# Patient Record
Sex: Female | Born: 1980 | Race: Black or African American | Hispanic: No | Marital: Single | Smoking: Current every day smoker
Health system: Southern US, Community
[De-identification: ages and names within clinical notes are randomized; demographics above are authoritative.]

## PROBLEM LIST (undated history)

## (undated) DIAGNOSIS — Z789 Other specified health status: Secondary | ICD-10-CM

## (undated) HISTORY — PX: ECTOPIC PREGNANCY SURGERY: SHX613

---

## 2012-04-03 ENCOUNTER — Encounter (HOSPITAL_COMMUNITY): Payer: Self-pay | Admitting: Emergency Medicine

## 2012-04-03 ENCOUNTER — Emergency Department (HOSPITAL_COMMUNITY)
Admission: EM | Admit: 2012-04-03 | Discharge: 2012-04-03 | Disposition: A | Discharge status: Home / Self Care | Payer: Medicaid Other | Source: Home / Self Care

## 2012-04-03 DIAGNOSIS — T300 Burn of unspecified body region, unspecified degree: Secondary | ICD-10-CM

## 2012-04-03 MED ORDER — HYDROCODONE-ACETAMINOPHEN 5-325 MG PO TABS: 1.0000 | ORAL_TABLET | ORAL | Status: DC | PRN

## 2012-04-03 MED ORDER — KETOROLAC TROMETHAMINE 60 MG/2ML IM SOLN
INTRAMUSCULAR | Status: AC
  Filled 2012-04-03: qty 2

## 2012-04-03 MED ORDER — KETOROLAC TROMETHAMINE 60 MG/2ML IM SOLN
60.0000 mg | Freq: Once | INTRAMUSCULAR | Status: AC
  Administered 2012-04-03: 60 mg via INTRAMUSCULAR
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
## 2012-04-04 NOTE — ED Provider Notes (Signed)
Medical screening examination/treatment/procedure(s) were performed by non-physician practitioner and as supervising physician I was immediately available for consultation/collaboration.  Merida Alcantar, M.D.   Tennessee Perra C Marcianne Ozbun, MD 04/04/12 0835 

## 2012-07-18 ENCOUNTER — Encounter (HOSPITAL_COMMUNITY): Payer: Self-pay | Admitting: *Deleted

## 2012-07-18 ENCOUNTER — Emergency Department (HOSPITAL_COMMUNITY)
Admission: EM | Admit: 2012-07-18 | Discharge: 2012-07-18 | Disposition: A | Discharge status: Home / Self Care | Payer: Medicaid Other | Attending: Emergency Medicine | Admitting: Emergency Medicine

## 2012-07-18 DIAGNOSIS — K5289 Other specified noninfective gastroenteritis and colitis: Secondary | ICD-10-CM | POA: Insufficient documentation

## 2012-07-18 DIAGNOSIS — R197 Diarrhea, unspecified: Secondary | ICD-10-CM | POA: Insufficient documentation

## 2012-07-18 DIAGNOSIS — F17200 Nicotine dependence, unspecified, uncomplicated: Secondary | ICD-10-CM | POA: Insufficient documentation

## 2012-07-18 DIAGNOSIS — R509 Fever, unspecified: Secondary | ICD-10-CM | POA: Insufficient documentation

## 2012-07-18 DIAGNOSIS — K529 Noninfective gastroenteritis and colitis, unspecified: Secondary | ICD-10-CM

## 2012-07-18 DIAGNOSIS — R630 Anorexia: Secondary | ICD-10-CM | POA: Insufficient documentation

## 2012-07-18 MED ORDER — ONDANSETRON 4 MG PO TBDP
4.0000 mg | ORAL_TABLET | Freq: Once | ORAL | Status: AC
  Administered 2012-07-18: 4 mg via ORAL
  Filled 2012-07-18: qty 1

## 2012-07-18 NOTE — ED Notes (Signed)
Pt has been having abdominal pain, vomiting, and diarrhea since yesterday.  No urinary symptoms.  No vaginal discharge or bleeding.

## 2012-07-18 NOTE — ED Provider Notes (Addendum)
History     CSN: [id number]  Arrival date & time 07/18/12  1351   First MD Initiated Contact with Patient 07/18/12 1415      Chief Complaint  Patient presents with  . Nausea  . Emesis  . Diarrhea    (Consider location/radiation/quality/duration/timing/severity/associated sxs/prior treatment) Patient is a 32 y.o. female presenting with vomiting and diarrhea. The history is provided by the patient.  Emesis  This is a new problem. The current episode started yesterday. The problem occurs 2 to 4 times per day. The problem has been resolved. The emesis has an appearance of stomach contents. The maximum temperature recorded prior to her arrival was 100 to 100.9 F. The fever has been present for less than 1 day. Associated symptoms include chills, diarrhea and a fever. Pertinent negatives include no abdominal pain. Risk factors include ill contacts.  Diarrhea The primary symptoms include fever, nausea, vomiting and diarrhea. Primary symptoms do not include abdominal pain. The illness began yesterday. The onset was sudden. The problem has not changed since onset. The illness is also significant for chills and anorexia.    History reviewed. No pertinent past medical history.  Past Surgical History  Procedure Date  . Ectopic pregnancy surgery     No family history on file.  History  Substance Use Topics  . Smoking status: Current Every Day Smoker -- 0.5 packs/day  . Smokeless tobacco: Not on file  . Alcohol Use: Yes    OB History    Grav Para Term Preterm Abortions TAB SAB Ect Mult Living                  Review of Systems  Constitutional: Positive for fever and chills.  Gastrointestinal: Positive for nausea, vomiting, diarrhea and anorexia. Negative for abdominal pain.  All other systems reviewed and are negative.    Allergies  Review of patient's allergies indicates no known allergies.  Home Medications  No current outpatient prescriptions on file.  BP 111/65   Pulse 69  Temp 99.1 F (37.3 C) (Oral)  Resp 18  SpO2 98%  Physical Exam  Nursing note and vitals reviewed. Constitutional: She is oriented to person, place, and time. She appears well-developed and well-nourished. No distress.  HENT:  Head: Normocephalic and atraumatic.  Mouth/Throat: Oropharynx is clear and moist.  Eyes: Conjunctivae normal and EOM are normal. Pupils are equal, round, and reactive to light.  Neck: Normal range of motion. Neck supple.  Cardiovascular: Normal rate, regular rhythm and intact distal pulses.   No murmur heard. Pulmonary/Chest: Effort normal and breath sounds normal. No respiratory distress. She has no wheezes. She has no rales.  Abdominal: Soft. She exhibits no distension. There is no tenderness. There is no rebound and no guarding.  Musculoskeletal: Normal range of motion. She exhibits no edema and no tenderness.  Neurological: She is alert and oriented to person, place, and time.  Skin: Skin is warm and dry. No rash noted. No erythema.  Psychiatric: She has a normal mood and affect. Her behavior is normal.    ED Course  Procedures (including critical care time)  Labs Reviewed - No data to display No results found.   1. Gastroenteritis       MDM   Pt with symptoms most consistent with a viral process with fever/vomitting/diarrhea.  Denies bad food exposure and recent travel out of the country.  No recent abx.  No hx concerning for GU pathology or kidney stones.  Pt is awake and alert  on exam without peritoneal signs.  Last vomiting yesterday but still some mild nausea.  Pt given odt zofran and d/ced home.          Gwyneth Sprout, MD 07/18/12 1437  Gwyneth Sprout, MD 07/18/12 1441

## 2012-07-18 NOTE — ED Notes (Signed)
Pt c/o n/v/d x 2 days. Pt denies abd pain now but reports she was having some yesterday. Pt in nad. Pt reports she has been able to keep fluids down but hasnt eaten much.

## 2013-02-07 ENCOUNTER — Emergency Department (HOSPITAL_COMMUNITY)
Admission: EM | Admit: 2013-02-07 | Discharge: 2013-02-07 | Disposition: A | Discharge status: Home / Self Care | Payer: Medicaid Other | Attending: Emergency Medicine | Admitting: Emergency Medicine

## 2013-02-07 ENCOUNTER — Encounter (HOSPITAL_COMMUNITY): Payer: Self-pay | Admitting: *Deleted

## 2013-02-07 DIAGNOSIS — K089 Disorder of teeth and supporting structures, unspecified: Secondary | ICD-10-CM | POA: Insufficient documentation

## 2013-02-07 DIAGNOSIS — K0889 Other specified disorders of teeth and supporting structures: Secondary | ICD-10-CM

## 2013-02-07 DIAGNOSIS — Z8742 Personal history of other diseases of the female genital tract: Secondary | ICD-10-CM | POA: Insufficient documentation

## 2013-02-07 DIAGNOSIS — F17200 Nicotine dependence, unspecified, uncomplicated: Secondary | ICD-10-CM | POA: Insufficient documentation

## 2013-02-07 MED ORDER — AMOXICILLIN 500 MG PO CAPS
500.0000 mg | ORAL_CAPSULE | Freq: Once | ORAL | Status: AC
  Administered 2013-02-07: 500 mg via ORAL
  Filled 2013-02-07 (×2): qty 1

## 2013-02-07 MED ORDER — ONDANSETRON 4 MG PO TBDP
4.0000 mg | ORAL_TABLET | Freq: Once | ORAL | Status: AC
  Administered 2013-02-07: 4 mg via ORAL
  Filled 2013-02-07: qty 1

## 2013-02-07 MED ORDER — OXYCODONE-ACETAMINOPHEN 5-325 MG PO TABS: 1.0000 | ORAL_TABLET | Freq: Four times a day (QID) | ORAL | Status: DC | PRN

## 2013-02-07 MED ORDER — OXYCODONE-ACETAMINOPHEN 5-325 MG PO TABS
1.0000 | ORAL_TABLET | Freq: Once | ORAL | Status: AC
  Administered 2013-02-07: 1 via ORAL
  Filled 2013-02-07: qty 1

## 2013-02-07 MED ORDER — AMOXICILLIN 500 MG PO CAPS: 500.0000 mg | ORAL_CAPSULE | Freq: Three times a day (TID) | ORAL | Status: DC

## 2013-02-07 NOTE — ED Provider Notes (Signed)
Medical screening examination/treatment/procedure(s) were performed by non-physician practitioner and as supervising physician I was immediately available for consultation/collaboration.  Charles B. Sheldon, MD 02/07/13 2213 

## 2013-02-07 NOTE — ED Provider Notes (Signed)
CSN: [id number]     Arrival date & time 02/07/13  1734 History     First MD Initiated Contact with Patient 02/07/13 1853     Chief Complaint  Patient presents with  . Dental Pain   (Consider location/radiation/quality/duration/timing/severity/associated sxs/prior Treatment) HPI Patient presents to the emergency department with a dental complaint. Symptoms began 2 days ago. The patient has tried to alleviate pain with OTC pain medications.  Pain rated at a 10/10, characterized as throbbing in nature and located left lower molar. Patient denies fever, night sweats, chills, difficulty swallowing or opening mouth, SOB, nuchal rigidity or decreased ROM of neck.  Patient does not have a dentist and requests a resource guide at discharge.  History reviewed. No pertinent past medical history. Past Surgical History  Procedure Laterality Date  . Ectopic pregnancy surgery     History reviewed. No pertinent family history. History  Substance Use Topics  . Smoking status: Current Every Day Smoker -- 0.50 packs/day  . Smokeless tobacco: Not on file  . Alcohol Use: Yes   OB History   Grav Para Term Preterm Abortions TAB SAB Ect Mult Living                 Review of Systems  HENT: Positive for dental problem.   All other systems reviewed and are negative.    Allergies  Review of patient's allergies indicates no known allergies.  Home Medications   Current Outpatient Rx  Name  Route  Sig  Dispense  Refill  . amoxicillin (AMOXIL) 500 MG capsule   Oral   Take 1 capsule (500 mg total) by mouth 3 (three) times daily.   21 capsule   0   . oxyCODONE-acetaminophen (PERCOCET/ROXICET) 5-325 MG per tablet   Oral   Take 1 tablet by mouth every 6 (six) hours as needed for pain.   15 tablet   0    BP 130/75  Pulse 83  Temp(Src) 98 F (36.7 C) (Oral)  Resp 20  Wt 150 lb (68.04 kg)  SpO2 100% Physical Exam  Constitutional: She appears well-developed and well-nourished. No distress.    HENT:  Head: Normocephalic and atraumatic.  Mouth/Throat: Uvula is midline, oropharynx is clear and moist and mucous membranes are normal. Normal dentition. Dental caries (Pts tooth shows no obvious abscess but moderate to severe tenderness to palpation of marked tooth) present. No edematous.    Eyes: Pupils are equal, round, and reactive to light.  Neck: Trachea normal, normal range of motion and full passive range of motion without pain. Neck supple.  Cardiovascular: Normal rate, regular rhythm, normal heart sounds and normal pulses.   Pulmonary/Chest: Effort normal and breath sounds normal. No respiratory distress. Chest wall is not dull to percussion. She exhibits no tenderness, no crepitus, no edema, no deformity and no retraction.  Abdominal: Normal appearance.  Musculoskeletal: Normal range of motion.  Neurological: She is alert. She has normal strength.  Skin: Skin is warm, dry and intact. She is not diaphoretic.  Psychiatric: She has a normal mood and affect. Her speech is normal. Cognition and memory are normal.    ED Course   Procedures (including critical care time)  Labs Reviewed - No data to display No results found. 1. Toothache     MDM  Patient has dental pain. No emergent s/sx's present. Patent airway. No trismus.  Will be given pain medication and antibiotics. I discussed the need to call dentist within 24/48 hours for follow-up. Dental referral given.  Return to ED precautions given.  Pt voiced understanding and has agreed to follow-up.    Dorthula Matas, PA-C 02/07/13 1859

## 2013-02-07 NOTE — ED Notes (Signed)
Pt in c/o left sided toothache with facial swelling since yesterday

## 2014-04-28 ENCOUNTER — Encounter (HOSPITAL_COMMUNITY): Payer: Self-pay | Admitting: Emergency Medicine

## 2014-04-28 ENCOUNTER — Emergency Department (HOSPITAL_COMMUNITY)
Admission: EM | Admit: 2014-04-28 | Discharge: 2014-04-28 | Disposition: A | Discharge status: Home / Self Care | Payer: Medicaid Other | Attending: Emergency Medicine | Admitting: Emergency Medicine

## 2014-04-28 ENCOUNTER — Emergency Department (HOSPITAL_COMMUNITY): Payer: Medicaid Other

## 2014-04-28 DIAGNOSIS — Z3202 Encounter for pregnancy test, result negative: Secondary | ICD-10-CM | POA: Insufficient documentation

## 2014-04-28 DIAGNOSIS — R079 Chest pain, unspecified: Secondary | ICD-10-CM | POA: Diagnosis present

## 2014-04-28 DIAGNOSIS — Z720 Tobacco use: Secondary | ICD-10-CM | POA: Diagnosis not present

## 2014-04-28 DIAGNOSIS — R0789 Other chest pain: Secondary | ICD-10-CM | POA: Diagnosis not present

## 2014-04-28 DIAGNOSIS — Z792 Long term (current) use of antibiotics: Secondary | ICD-10-CM | POA: Diagnosis not present

## 2014-04-28 DIAGNOSIS — N390 Urinary tract infection, site not specified: Secondary | ICD-10-CM | POA: Diagnosis not present

## 2014-04-28 LAB — COMPREHENSIVE METABOLIC PANEL
ALT: 11 U/L (ref 0–35)
ANION GAP: 11 (ref 5–15)
AST: 13 U/L (ref 0–37)
Albumin: 3.7 g/dL (ref 3.5–5.2)
Alkaline Phosphatase: 59 U/L (ref 39–117)
BUN: 10 mg/dL (ref 6–23)
CALCIUM: 9 mg/dL (ref 8.4–10.5)
CO2: 23 meq/L (ref 19–32)
Chloride: 107 mEq/L (ref 96–112)
Creatinine, Ser: 1.12 mg/dL — ABNORMAL HIGH (ref 0.50–1.10)
GFR, EST AFRICAN AMERICAN: 74 mL/min — AB (ref >90)
GFR, EST NON AFRICAN AMERICAN: 64 mL/min — AB (ref >90)
GLUCOSE: 83 mg/dL (ref 70–99)
Potassium: 4.3 mEq/L (ref 3.7–5.3)
Sodium: 141 mEq/L (ref 137–147)
TOTAL PROTEIN: 7.4 g/dL (ref 6.0–8.3)
Total Bilirubin: <0.2 mg/dL — ABNORMAL LOW (ref 0.3–1.2)

## 2014-04-28 LAB — CBC
HEMATOCRIT: 39.7 % (ref 36.0–46.0)
HEMOGLOBIN: 12.7 g/dL (ref 12.0–15.0)
MCH: 30 pg (ref 26.0–34.0)
MCHC: 32 g/dL (ref 30.0–36.0)
MCV: 93.9 fL (ref 78.0–100.0)
Platelets: 220 10*3/uL (ref 150–400)
RBC: 4.23 MIL/uL (ref 3.87–5.11)
RDW: 12.7 % (ref 11.5–15.5)
WBC: 9.4 10*3/uL (ref 4.0–10.5)

## 2014-04-28 LAB — URINALYSIS, ROUTINE W REFLEX MICROSCOPIC
BILIRUBIN URINE: NEGATIVE
GLUCOSE, UA: NEGATIVE mg/dL
Hgb urine dipstick: NEGATIVE
KETONES UR: NEGATIVE mg/dL
NITRITE: NEGATIVE
PH: 7 (ref 5.0–8.0)
PROTEIN: NEGATIVE mg/dL
Specific Gravity, Urine: 1.027 (ref 1.005–1.030)
Urobilinogen, UA: 0.2 mg/dL (ref 0.0–1.0)

## 2014-04-28 LAB — URINE MICROSCOPIC-ADD ON

## 2014-04-28 LAB — POC URINE PREG, ED: Preg Test, Ur: NEGATIVE

## 2014-04-28 LAB — I-STAT TROPONIN, ED: Troponin i, poc: 0 ng/mL (ref 0.00–0.08)

## 2014-04-28 MED ORDER — NAPROXEN 500 MG PO TABS: 500.0000 mg | ORAL_TABLET | Freq: Two times a day (BID) | ORAL | Status: DC

## 2014-04-28 MED ORDER — SULFAMETHOXAZOLE-TRIMETHOPRIM 800-160 MG PO TABS
1.0000 | ORAL_TABLET | Freq: Two times a day (BID) | ORAL | Status: DC
Start: 2014-04-28 — End: 2015-04-03

## 2014-04-28 NOTE — ED Notes (Signed)
Pt reports chest pain and cough for 2 weeks. States pain is under left breast and central chest that radiates to back. Denies SOB, N/V.

## 2014-04-28 NOTE — ED Notes (Signed)
Cough and cp for a couple of weeks that came and went today steady pain  worse today no n/v/  Has had occ sob

## 2014-04-28 NOTE — ED Provider Notes (Signed)
CSN: [id number]     Arrival date & time 04/28/14  0935 History   First MD Initiated Contact with Patient 04/28/14 1108     Chief Complaint  Patient presents with  . Chest Pain     (Consider location/radiation/quality/duration/timing/severity/associated sxs/prior Treatment) HPI Kelly Avila is a 33 y.o. female who presents emergency department complaining of intermittent chest pains. Patient is currently chest pain-free. She denies any history of heart or lung problems. She is an every day smoker. States pain does come and go, independent of activity or body position. She states she does not have increased pain with deep breathing. She does report cough for several weeks, with clear mucus. She states it does sometimes hurt when she coughs. She denies pain radiation. She denies abdominal pain. She denies any acid reflux complaints. She did not take any medications for this. She states for the last 2 weeks pain will come and go for a few minutes at a time, this morning when it started it would not go away for several hours which made her come to the ER. No treatment prior to coming in. Patient denies fever chills. Denies any recent travel. She does not take any birth control, denies being pregnant. The swelling in extremities. No calf pain.   History reviewed. No pertinent past medical history. Past Surgical History  Procedure Laterality Date  . Ectopic pregnancy surgery     No family history on file. History  Substance Use Topics  . Smoking status: Current Every Day Smoker -- 0.50 packs/day  . Smokeless tobacco: Not on file  . Alcohol Use: Yes   OB History   Grav Para Term Preterm Abortions TAB SAB Ect Mult Living                 Review of Systems  Constitutional: Negative for fever and chills.  Respiratory: Positive for cough and chest tightness. Negative for shortness of breath, wheezing and stridor.   Cardiovascular: Positive for chest pain. Negative for palpitations and leg  swelling.  Gastrointestinal: Negative for nausea, vomiting, abdominal pain and diarrhea.  Musculoskeletal: Negative for arthralgias, myalgias, neck pain and neck stiffness.  Skin: Negative for rash.  Neurological: Negative for dizziness, weakness and headaches.  All other systems reviewed and are negative.     Allergies  Review of patient's allergies indicates no known allergies.  Home Medications   Prior to Admission medications   Medication Sig Start Date End Date Taking? Authorizing Provider  amoxicillin (AMOXIL) 500 MG capsule Take 1 capsule (500 mg total) by mouth 3 (three) times daily. 02/07/13   Tiffany Irine SealG Greene, PA-C  oxyCODONE-acetaminophen (PERCOCET/ROXICET) 5-325 MG per tablet Take 1 tablet by mouth every 6 (six) hours as needed for pain. 02/07/13   Tiffany Irine SealG Greene, PA-C   BP 121/72  Pulse 85  Temp(Src) 98.3 F (36.8 C) (Oral)  Resp 16  SpO2 100%  LMP 04/12/2014 Physical Exam  Nursing note and vitals reviewed. Constitutional: She appears well-developed and well-nourished. No distress.  HENT:  Head: Normocephalic.  Eyes: Conjunctivae are normal.  Neck: Neck supple.  Cardiovascular: Normal rate, regular rhythm and normal heart sounds.   Pulmonary/Chest: Effort normal and breath sounds normal. No respiratory distress. She has no wheezes. She has no rales. She exhibits tenderness.  Tenderness over lower sternum.   Abdominal: Soft. Bowel sounds are normal. She exhibits no distension. There is no tenderness. There is no rebound.  Musculoskeletal: She exhibits no edema.  Negative homans sign bilateral. DP pulses equal  bilaterally.  Neurological: She is alert.  Skin: Skin is warm and dry.  Psychiatric: She has a normal mood and affect. Her behavior is normal.    ED Course  Procedures (including critical care time) Labs Review Labs Reviewed  COMPREHENSIVE METABOLIC PANEL - Abnormal; Notable for the following:    Creatinine, Ser 1.12 (*)    Total Bilirubin <0.2 (*)     GFR calc non Af Amer 64 (*)    GFR calc Af Amer 74 (*)    All other components within normal limits  URINALYSIS, ROUTINE W REFLEX MICROSCOPIC - Abnormal; Notable for the following:    APPearance TURBID (*)    Leukocytes, UA LARGE (*)    All other components within normal limits  URINE MICROSCOPIC-ADD ON - Abnormal; Notable for the following:    Squamous Epithelial / LPF MANY (*)    Bacteria, UA MANY (*)    All other components within normal limits  CBC  I-STAT TROPOININ, ED  POC URINE PREG, ED    Imaging Review Dg Chest 2 View  04/28/2014   CLINICAL DATA:  Chest pain.  EXAM: CHEST  2 VIEW  COMPARISON:  None.  FINDINGS: Mediastinum and hilar structures normal. Lungs are clear. No pleural effusion or pneumothorax. No acute bony abnormality.  IMPRESSION: No active cardiopulmonary disease.   Electronically Signed   By: Maisie Fushomas  Register   On: 04/28/2014 10:51     EKG Interpretation   Date/Time:  Friday April 28 2014 09:42:24 EDT Ventricular Rate:  84 PR Interval:  132 QRS Duration: 76 QT Interval:  348 QTC Calculation: 411 R Axis:   89 Text Interpretation:  Normal sinus rhythm Right atrial enlargement  Borderline ECG No old tracing to compare Confirmed by BELFI  MD, MELANIE  ([id number]) on 04/28/2014 2:08:24 PM      MDM   Final diagnoses:  Chest pain, unspecified chest pain type  UTI (lower urinary tract infection)   Pt with chest pain, reproducible with palpation over lower sternum. ECG normal. Labs and CXR pending. Pt's vs normal.   2:08 PM pts labs, CXR with no significant findings to explain her CP. She is PERC negative, doubt PE. No risk factors for CAD. TIMI and Heart Score 0. Will d/c home with outpatient follow up. UA infected will start on bactrim. Naprosyn for pain and inflammation. Most likely chest wall pain.   Filed Vitals:   04/28/14 1215 04/28/14 1300 04/28/14 1330 04/28/14 1345  BP: 107/60 105/60 115/76 110/72  Pulse: 69 64 70 72  Temp:      TempSrc:       Resp: 18     SpO2: 100% 100% 100% 100%     Davarion Cuffee A Carlyn Lemke, PA-C 04/28/14 1410

## 2014-04-28 NOTE — ED Provider Notes (Signed)
Medical screening examination/treatment/procedure(s) were performed by non-physician practitioner and as supervising physician I was immediately available for consultation/collaboration.   EKG Interpretation   Date/Time:  Friday April 28 2014 09:42:24 EDT Ventricular Rate:  84 PR Interval:  132 QRS Duration: 76 QT Interval:  348 QTC Calculation: 411 R Axis:   89 Text Interpretation:  Normal sinus rhythm Right atrial enlargement  Borderline ECG No old tracing to compare Confirmed by Gimena Buick  MD, Ayvion Kavanagh  ([id number]) on 04/28/2014 2:08:24 PM        Rolan BuccoMelanie Malillany Kazlauskas, MD 04/28/14 1542

## 2014-04-28 NOTE — Discharge Instructions (Signed)
Naprosyn for pain and inflammation. Bactrim for UTI. Follow up with your doctor for recheck. Urine today did show infection. Labs otherwise normal. Chest xray normal. Return if symptoms worsening.    Chest Pain (Nonspecific) It is often hard to give a specific diagnosis for the cause of chest pain. There is always a chance that your pain could be related to something serious, such as a heart attack or a blood clot in the lungs. You need to follow up with your health care provider for further evaluation. CAUSES   Heartburn.  Pneumonia or bronchitis.  Anxiety or stress.  Inflammation around your heart (pericarditis) or lung (pleuritis or pleurisy).  A blood clot in the lung.  A collapsed lung (pneumothorax). It can develop suddenly on its own (spontaneous pneumothorax) or from trauma to the chest.  Shingles infection (herpes zoster virus). The chest wall is composed of bones, muscles, and cartilage. Any of these can be the source of the pain.  The bones can be bruised by injury.  The muscles or cartilage can be strained by coughing or overwork.  The cartilage can be affected by inflammation and become sore (costochondritis). DIAGNOSIS  Lab tests or other studies may be needed to find the cause of your pain. Your health care provider may have you take a test called an ambulatory electrocardiogram (ECG). An ECG records your heartbeat patterns over a 24-hour period. You may also have other tests, such as:  Transthoracic echocardiogram (TTE). During echocardiography, sound waves are used to evaluate how blood flows through your heart.  Transesophageal echocardiogram (TEE).  Cardiac monitoring. This allows your health care provider to monitor your heart rate and rhythm in real time.  Holter monitor. This is a portable device that records your heartbeat and can help diagnose heart arrhythmias. It allows your health care provider to track your heart activity for several days, if  needed.  Stress tests by exercise or by giving medicine that makes the heart beat faster. TREATMENT   Treatment depends on what may be causing your chest pain. Treatment may include:  Acid blockers for heartburn.  Anti-inflammatory medicine.  Pain medicine for inflammatory conditions.  Antibiotics if an infection is present.  You may be advised to change lifestyle habits. This includes stopping smoking and avoiding alcohol, caffeine, and chocolate.  You may be advised to keep your head raised (elevated) when sleeping. This reduces the chance of acid going backward from your stomach into your esophagus. Most of the time, nonspecific chest pain will improve within 2-3 days with rest and mild pain medicine.  HOME CARE INSTRUCTIONS   If antibiotics were prescribed, take them as directed. Finish them even if you start to feel better.  For the next few days, avoid physical activities that bring on chest pain. Continue physical activities as directed.  Do not use any tobacco products, including cigarettes, chewing tobacco, or electronic cigarettes.  Avoid drinking alcohol.  Only take medicine as directed by your health care provider.  Follow your health care provider's suggestions for further testing if your chest pain does not go away.  Keep any follow-up appointments you made. If you do not go to an appointment, you could develop lasting (chronic) problems with pain. If there is any problem keeping an appointment, call to reschedule. SEEK MEDICAL CARE IF:   Your chest pain does not go away, even after treatment.  You have a rash with blisters on your chest.  You have a fever. SEEK IMMEDIATE MEDICAL CARE IF:  You have increased chest pain or pain that spreads to your arm, neck, jaw, back, or abdomen.  You have shortness of breath.  You have an increasing cough, or you cough up blood.  You have severe back or abdominal pain.  You feel nauseous or vomit.  You have severe  weakness.  You faint.  You have chills. This is an emergency. Do not wait to see if the pain will go away. Get medical help at once. Call your local emergency services (911 in [place] Do not drive yourself to the hospital. MAKE SURE YOU:   Understand these instructions.  Will watch your condition.  Will get help right away if you are not doing well or get worse. Document Released: 04/09/2005 Document Revised: 07/05/2013 Document Reviewed: 02/03/2008 [facility] Patient Information 2015 [employer], [place]. This information is not intended to replace advice given to you by your health care provider. Make sure you discuss any questions you have with your health care provider.  Urinary Tract Infection Urinary tract infections (UTIs) can develop anywhere along your urinary tract. Your urinary tract is your body's drainage system for removing wastes and extra water. Your urinary tract includes two kidneys, two ureters, a bladder, and a urethra. Your kidneys are a pair of bean-shaped organs. Each kidney is about the size of your fist. They are located below your ribs, one on each side of your spine. CAUSES Infections are caused by microbes, which are microscopic organisms, including fungi, viruses, and bacteria. These organisms are so small that they can only be seen through a microscope. Bacteria are the microbes that most commonly cause UTIs. SYMPTOMS  Symptoms of UTIs may vary by age and gender of the patient and by the location of the infection. Symptoms in young women typically include a frequent and intense urge to urinate and a painful, burning feeling in the bladder or urethra during urination. Older women and men are more likely to be tired, shaky, and weak and have muscle aches and abdominal pain. A fever may mean the infection is in your kidneys. Other symptoms of a kidney infection include pain in your back or sides below the ribs, nausea, and vomiting. DIAGNOSIS To diagnose a UTI, your  caregiver will ask you about your symptoms. Your caregiver also will ask to provide a urine sample. The urine sample will be tested for bacteria and white blood cells. White blood cells are made by your body to help fight infection. TREATMENT  Typically, UTIs can be treated with medication. Because most UTIs are caused by a bacterial infection, they usually can be treated with the use of antibiotics. The choice of antibiotic and length of treatment depend on your symptoms and the type of bacteria causing your infection. HOME CARE INSTRUCTIONS  If you were prescribed antibiotics, take them exactly as your caregiver instructs you. Finish the medication even if you feel better after you have only taken some of the medication.  Drink enough water and fluids to keep your urine clear or pale yellow.  Avoid caffeine, tea, and carbonated beverages. They tend to irritate your bladder.  Empty your bladder often. Avoid holding urine for long periods of time.  Empty your bladder before and after sexual intercourse.  After a bowel movement, women should cleanse from front to back. Use each tissue only once. SEEK MEDICAL CARE IF:   You have back pain.  You develop a fever.  Your symptoms do not begin to resolve within 3 days. SEEK IMMEDIATE MEDICAL CARE IF:  You have severe back pain or lower abdominal pain.  You develop chills.  You have nausea or vomiting.  You have continued burning or discomfort with urination. MAKE SURE YOU:   Understand these instructions.  Will watch your condition.  Will get help right away if you are not doing well or get worse. Document Released: 04/09/2005 Document Revised: 12/30/2011 Document Reviewed: 08/08/2011 [facility] Patient Information 2015 [place], [place]. This information is not intended to replace advice given to you by your health care provider. Make sure you discuss any questions you have with your health care provider.

## 2014-08-30 ENCOUNTER — Emergency Department (HOSPITAL_COMMUNITY)
Admission: EM | Admit: 2014-08-30 | Discharge: 2014-08-30 | Disposition: A | Discharge status: Home / Self Care | Payer: Self-pay | Attending: Emergency Medicine | Admitting: Emergency Medicine

## 2014-08-30 ENCOUNTER — Encounter (HOSPITAL_COMMUNITY): Payer: Self-pay | Admitting: *Deleted

## 2014-08-30 ENCOUNTER — Emergency Department (HOSPITAL_COMMUNITY): Payer: Medicaid Other

## 2014-08-30 DIAGNOSIS — Z3A08 8 weeks gestation of pregnancy: Secondary | ICD-10-CM | POA: Insufficient documentation

## 2014-08-30 DIAGNOSIS — O210 Mild hyperemesis gravidarum: Secondary | ICD-10-CM | POA: Insufficient documentation

## 2014-08-30 DIAGNOSIS — K920 Hematemesis: Secondary | ICD-10-CM | POA: Insufficient documentation

## 2014-08-30 DIAGNOSIS — Z791 Long term (current) use of non-steroidal anti-inflammatories (NSAID): Secondary | ICD-10-CM | POA: Insufficient documentation

## 2014-08-30 DIAGNOSIS — Z3491 Encounter for supervision of normal pregnancy, unspecified, first trimester: Secondary | ICD-10-CM

## 2014-08-30 DIAGNOSIS — Z792 Long term (current) use of antibiotics: Secondary | ICD-10-CM | POA: Insufficient documentation

## 2014-08-30 DIAGNOSIS — A5901 Trichomonal vulvovaginitis: Secondary | ICD-10-CM | POA: Insufficient documentation

## 2014-08-30 DIAGNOSIS — O26899 Other specified pregnancy related conditions, unspecified trimester: Secondary | ICD-10-CM

## 2014-08-30 DIAGNOSIS — O99411 Diseases of the circulatory system complicating pregnancy, first trimester: Secondary | ICD-10-CM | POA: Insufficient documentation

## 2014-08-30 DIAGNOSIS — O99341 Other mental disorders complicating pregnancy, first trimester: Secondary | ICD-10-CM | POA: Insufficient documentation

## 2014-08-30 DIAGNOSIS — O99331 Smoking (tobacco) complicating pregnancy, first trimester: Secondary | ICD-10-CM | POA: Insufficient documentation

## 2014-08-30 DIAGNOSIS — F17200 Nicotine dependence, unspecified, uncomplicated: Secondary | ICD-10-CM | POA: Insufficient documentation

## 2014-08-30 DIAGNOSIS — R109 Unspecified abdominal pain: Secondary | ICD-10-CM

## 2014-08-30 LAB — URINALYSIS, ROUTINE W REFLEX MICROSCOPIC
Glucose, UA: NEGATIVE mg/dL
Ketones, ur: 40 mg/dL — AB
NITRITE: NEGATIVE
PH: 6 (ref 5.0–8.0)
Protein, ur: 30 mg/dL — AB
SPECIFIC GRAVITY, URINE: 1.033 — AB (ref 1.005–1.030)
UROBILINOGEN UA: 1 mg/dL (ref 0.0–1.0)

## 2014-08-30 LAB — COMPREHENSIVE METABOLIC PANEL
ALBUMIN: 3.7 g/dL (ref 3.5–5.2)
ALK PHOS: 49 U/L (ref 39–117)
ALT: 13 U/L (ref 0–35)
AST: 15 U/L (ref 0–37)
Anion gap: 6 (ref 5–15)
BUN: 6 mg/dL (ref 6–23)
CALCIUM: 9.1 mg/dL (ref 8.4–10.5)
CHLORIDE: 104 mmol/L (ref 96–112)
CO2: 23 mmol/L (ref 19–32)
Creatinine, Ser: 0.88 mg/dL (ref 0.50–1.10)
GFR calc Af Amer: >90 mL/min (ref >90)
GFR calc non Af Amer: 85 mL/min — ABNORMAL LOW (ref >90)
GLUCOSE: 83 mg/dL (ref 70–99)
POTASSIUM: 3.5 mmol/L (ref 3.5–5.1)
SODIUM: 133 mmol/L — AB (ref 135–145)
TOTAL PROTEIN: 6.9 g/dL (ref 6.0–8.3)
Total Bilirubin: 0.6 mg/dL (ref 0.3–1.2)

## 2014-08-30 LAB — ABO/RH: ABO/RH(D): O POS

## 2014-08-30 LAB — CBC WITH DIFFERENTIAL/PLATELET
BASOS PCT: 0 % (ref 0–1)
Basophils Absolute: 0 10*3/uL (ref 0.0–0.1)
EOS ABS: 0.2 10*3/uL (ref 0.0–0.7)
Eosinophils Relative: 2 % (ref 0–5)
HCT: 37.4 % (ref 36.0–46.0)
Hemoglobin: 12.6 g/dL (ref 12.0–15.0)
Lymphocytes Relative: 23 % (ref 12–46)
Lymphs Abs: 2.3 10*3/uL (ref 0.7–4.0)
MCH: 30.3 pg (ref 26.0–34.0)
MCHC: 33.7 g/dL (ref 30.0–36.0)
MCV: 89.9 fL (ref 78.0–100.0)
Monocytes Absolute: 0.7 10*3/uL (ref 0.1–1.0)
Monocytes Relative: 7 % (ref 3–12)
NEUTROS PCT: 68 % (ref 43–77)
Neutro Abs: 6.8 10*3/uL (ref 1.7–7.7)
PLATELETS: 171 10*3/uL (ref 150–400)
RBC: 4.16 MIL/uL (ref 3.87–5.11)
RDW: 12.1 % (ref 11.5–15.5)
WBC: 9.9 10*3/uL (ref 4.0–10.5)

## 2014-08-30 LAB — WET PREP, GENITAL
CLUE CELLS WET PREP: NONE SEEN
Yeast Wet Prep HPF POC: NONE SEEN

## 2014-08-30 LAB — LIPASE, BLOOD: LIPASE: 20 U/L (ref 11–59)

## 2014-08-30 LAB — URINE MICROSCOPIC-ADD ON

## 2014-08-30 LAB — HCG, QUANTITATIVE, PREGNANCY: hCG, Beta Chain, Quant, S: 168405 m[IU]/mL — ABNORMAL HIGH (ref <5)

## 2014-08-30 MED ORDER — METRONIDAZOLE 0.75 % VA GEL: 1.0000 | Freq: Two times a day (BID) | VAGINAL | Status: DC

## 2014-08-30 MED ORDER — CEFTRIAXONE SODIUM 250 MG IJ SOLR
250.0000 mg | Freq: Once | INTRAMUSCULAR | Status: AC
  Administered 2014-08-30: 250 mg via INTRAMUSCULAR
  Filled 2014-08-30: qty 250

## 2014-08-30 MED ORDER — LIDOCAINE HCL (PF) 1 % IJ SOLN
INTRAMUSCULAR | Status: AC
  Filled 2014-08-30: qty 5

## 2014-08-30 MED ORDER — SODIUM CHLORIDE 0.9 % IV BOLUS (SEPSIS)
1000.0000 mL | Freq: Once | INTRAVENOUS | Status: AC
  Administered 2014-08-30: 1000 mL via INTRAVENOUS

## 2014-08-30 MED ORDER — ONDANSETRON 4 MG PO TBDP
8.0000 mg | ORAL_TABLET | Freq: Once | ORAL | Status: AC
  Administered 2014-08-30: 8 mg via ORAL

## 2014-08-30 MED ORDER — PANTOPRAZOLE SODIUM 40 MG IV SOLR
40.0000 mg | Freq: Once | INTRAVENOUS | Status: AC
  Administered 2014-08-30: 40 mg via INTRAVENOUS
  Filled 2014-08-30: qty 40

## 2014-08-30 MED ORDER — LIDOCAINE HCL (PF) 1 % IJ SOLN
0.9000 mL | Freq: Once | INTRAMUSCULAR | Status: AC
  Administered 2014-08-30: 0.9 mL

## 2014-08-30 MED ORDER — OMEPRAZOLE 20 MG PO CPDR: 20.0000 mg | DELAYED_RELEASE_CAPSULE | Freq: Every day | ORAL | Status: DC

## 2014-08-30 MED ORDER — AZITHROMYCIN 250 MG PO TABS
1000.0000 mg | ORAL_TABLET | Freq: Once | ORAL | Status: AC
  Administered 2014-08-30: 1000 mg via ORAL
  Filled 2014-08-30: qty 4

## 2014-08-30 MED ORDER — DOXYLAMINE-PYRIDOXINE 10-10 MG PO TBEC: DELAYED_RELEASE_TABLET | ORAL | Status: DC

## 2014-08-30 MED ORDER — ONDANSETRON 4 MG PO TBDP
ORAL_TABLET | ORAL | Status: AC
  Filled 2014-08-30: qty 2

## 2014-08-30 NOTE — ED Notes (Signed)
Pt reports being approx [redacted] weeks pregnant, having bad morning sickness and vomiting, now vomiting blood x 2 days. No acute distress noted at triage.

## 2014-08-30 NOTE — Discharge Instructions (Signed)
Your nausea and vomiting can be normal in early pregnancy, but if it's causing you to get dehydrated you need to take medications to help with these symptoms. Unisom 10mg  + vitamin B6 10mg  together are known as Diclegis at the pharmacy, but this can be expensive therefore you can get them separately and use them the same way Diclegis was ordered. Stay well hydrated. Take prenatal vitamins. [facility], or at your OBGYN. Use prilosec for 5 days to protect the lining of your stomach, since it seems your bloody vomit was likely from a small tear in your esophagus. Have you sexual partners follow up with [facility] for testing and treatment of STDs since you were found to have trichomonas. You have been treated for gonorrhea and chlamydia in the ER but the hospital will call you if lab is positive. You were tested for HIV and Syphilis, and the hospital will call you if the lab is positive. Use metrogel as directed for trichomonas infection and follow up with the OBGYN for further care. Return to the [facility] ER (called the [facility]) for changes or worsening symptoms.   Eating Plan for Hyperemesis Gravidarum Severe cases of hyperemesis gravidarum can lead to dehydration and malnutrition. The hyperemesis eating plan is one way to lessen the symptoms of nausea and vomiting. It is often used with prescribed medicines to control your symptoms.  WHAT CAN I DO TO RELIEVE MY SYMPTOMS? Listen to your body. Everyone is different and has different preferences. Find what works best for you. Some of the following things may help:  Eat and drink slowly.  Eat 5-6 small meals daily instead of 3 large meals.   Eat crackers before you get out of bed in the morning.   Starchy foods are usually well tolerated (such as cereal, toast, bread, potatoes, pasta, rice, and pretzels).   Ginger may help with nausea. Add  tsp ground ginger to hot tea or  choose ginger tea.   Try drinking 100% fruit juice or an electrolyte drink.  Continue to take your prenatal vitamins as directed by your health care provider. If you are having trouble taking your prenatal vitamins, talk with your health care provider about different options.  Include at least 1 serving of protein with your meals and snacks (such as meats or poultry, beans, nuts, eggs, or yogurt). Try eating a protein-rich snack before bed (such as cheese and crackers or a half [place] or peanut butter sandwich). WHAT THINGS SHOULD I AVOID TO REDUCE MY SYMPTOMS? The following things may help reduce your symptoms:  Avoid foods with strong smells. Try eating meals in well-ventilated areas that are free of odors.  Avoid drinking water or other beverages with meals. Try not to drink anything less than 30 minutes before and after meals.  Avoid drinking more than 1 cup of fluid at a time.  Avoid fried or high-fat foods, such as butter and cream sauces.  Avoid spicy foods.  Avoid skipping meals the best you can. Nausea can be more intense on an empty stomach. If you cannot tolerate food at that time, do not force it. Try sucking on ice chips or other frozen items and make up the calories later.  Avoid lying down within 2 hours after eating. Document Released: 04/27/2007 Document Revised: 07/05/2013 Document Reviewed: 05/04/2013 [facility] Patient Information 2015 [place], [place]. This information is not intended to replace advice given to you by your health care provider.  Make sure you discuss any questions you have with your health care provider.  Hyperemesis Gravidarum Hyperemesis gravidarum is a severe form of nausea and vomiting that happens during pregnancy. Hyperemesis is worse than morning sickness. It may cause you to have nausea or vomiting all day for many days. It may keep you from eating and drinking enough food and liquids. Hyperemesis usually occurs during the first half (the first 20  weeks) of pregnancy. It often goes away once a woman is in her second half of pregnancy. However, sometimes hyperemesis continues through an entire pregnancy.  CAUSES  The cause of this condition is not completely known but is thought to be related to changes in the body's hormones when pregnant. It could be from the high level of the pregnancy hormone or an increase in estrogen in the body.  SIGNS AND SYMPTOMS   Severe nausea and vomiting.  Nausea that does not go away.  Vomiting that does not allow you to keep any food down.  Weight loss and body fluid loss (dehydration).  Having no desire to eat or not liking food you have previously enjoyed. DIAGNOSIS  Your health care provider will do a physical exam and ask you about your symptoms. He or she may also order blood tests and urine tests to make sure something else is not causing the problem.  TREATMENT  You may only need medicine to control the problem. If medicines do not control the nausea and vomiting, you will be treated in the hospital to prevent dehydration, increased acid in the blood (acidosis), weight loss, and changes in the electrolytes in your body that may harm the unborn baby (fetus). You may need IV fluids.  HOME CARE INSTRUCTIONS   Only take over-the-counter or prescription medicines as directed by your health care provider.  Try eating a couple of dry crackers or toast in the morning before getting out of bed.  Avoid foods and smells that upset your stomach.  Avoid fatty and spicy foods.  Eat 5-6 small meals a day.  Do not drink when eating meals. Drink between meals.  For snacks, eat high-protein foods, such as cheese.  Eat or suck on things that have ginger in them. Ginger helps nausea.  Avoid food preparation. The smell of food can spoil your appetite.  Avoid iron pills and iron in your multivitamins until after 3-4 months of being pregnant. However, consult with your health care provider before stopping  any prescribed iron pills. SEEK MEDICAL CARE IF:   Your abdominal pain increases.  You have a severe headache.  You have vision problems.  You are losing weight. SEEK IMMEDIATE MEDICAL CARE IF:   You are unable to keep fluids down.  You vomit blood.  You have constant nausea and vomiting.  You have excessive weakness.  You have extreme thirst.  You have dizziness or fainting.  You have a fever or persistent symptoms for more than 2-3 days.  You have a fever and your symptoms suddenly get worse. MAKE SURE YOU:   Understand these instructions.  Will watch your condition.  Will get help right away if you are not doing well or get worse. Document Released: 06/30/2005 Document Revised: 04/20/2013 Document Reviewed: 02/09/2013 [facility] Patient Information 2015 [place], [place]. This information is not intended to replace advice given to you by your health care provider. Make sure you discuss any questions you have with your health care provider.  Mallory-Weiss Syndrome Mallory-Weiss syndrome refers to bleeding from tears in the  lining of the esophagus near where it meets the stomach. This is often caused by forceful vomiting, retching or coughing. This condition is often associated with alcoholism. Usually the bleeding stops by itself after 24 to 48 hours. Sometimes endoscopic or surgical treatment is needed. This condition is not usually fatal. SYMPTOMS  Vomiting of bright red or black coffee ground like material.  Black, tarry stools.  Low blood pressure causing you to feel faint or experience loss of consciousness. DIAGNOSIS  Definitive diagnosis is by endoscopy. Treatment is usually supportive. Persistent bleeding is uncommon. Sometimes cauterization or injection of epinephrine to stop the bleeding is used during the diagnostic endoscopy. Embolization (obstruction) of the arteries supplying the area of bleeding is sometimes used to stop the bleeding.  An NG tube  (naso-gastric tube) may be inserted to determine where the bleeding is coming from.  Often an EGD (esophagogastroduodenoscopy) is done. In this procedure there is a small flexible tube-like telescope (endoscope) put into your mouth, through your esophagus (the food tube leading from your mouth to your stomach), down into your stomach and into the small bowel. Through this your caregiver can see what and where the problem is. TREATMENT  It is necessary to stop the bleeding as soon as possible. During the EGD, your caregiver may inject medication into bleeding vessels to clot them.  SEEK IMMEDIATE MEDICAL CARE IF:  You have persistent dizziness, lightheadedness, or fainting.  Your vomiting returns and you have blood in your stools.  You have vomit that is bright red blood or black coffee ground-like blood, bright red blood in the stool or black tarry stools.  You have chest pain.  You cannot eat or drink.  You have nausea or vomiting. MAKE SURE YOU:   Understand these instructions.  Will watch your condition.  Will get help right away if you are not doing well or get worse. Document Released: 11/17/2005 Document Revised: 09/22/2011 Document Reviewed: 08/24/2013 [facility] Patient Information 2015 [place]. This information is not intended to replace advice given to you by your health care provider. Make sure you discuss any questions you have with your health care provider.  Abdominal Pain During Pregnancy Belly (abdominal) pain is common during pregnancy. Most of the time, it is not a serious problem. Other times, it can be a sign that something is wrong with the pregnancy. Always tell your doctor if you have belly pain. HOME CARE Monitor your belly pain for any changes. The following actions may help you feel better:  Do not have sex (intercourse) or put anything in your vagina until you feel better.  Rest until your pain stops.  Drink clear fluids if you feel sick to your  stomach (nauseous). Do not eat solid food until you feel better.  Only take medicine as told by your doctor.  Keep all doctor visits as told. GET HELP RIGHT AWAY IF:   You are bleeding, leaking fluid, or pieces of tissue come out of your vagina.  You have more pain or cramping.  You keep throwing up (vomiting).  You have pain when you pee (urinate) or have blood in your pee.  You have a fever.  You do not feel your baby moving as much.  You feel very weak or feel like passing out.  You have trouble breathing, with or without belly pain.  You have a very bad headache and belly pain.  You have fluid leaking from your vagina and belly pain.  You keep having watery poop (  diarrhea).  Your belly pain does not go away after resting, or the pain gets worse. MAKE SURE YOU:   Understand these instructions.  Will watch your condition.  Will get help right away if you are not doing well or get worse. Document Released: 06/18/2009 Document Revised: 03/02/2013 Document Reviewed: 01/27/2013 [facility] Patient Information 2015 [place], [place]. This information is not intended to replace advice given to you by your health care provider. Make sure you discuss any questions you have with your health care provider.  Medicines During Pregnancy During pregnancy, there are medicines that are either safe or unsafe to take. Medicines include prescriptions from your caregiver, over-the-counter medicines, topical creams applied to the skin, and all herbal substances. Medicines are put into either Class A, B, C, or D. Class A and B medicines have been shown to be safe in pregnancy. Class C medicines are also considered to be safe in pregnancy, but these medicines should only be used when necessary. Class D medicines should not be used at all in pregnancy. They can be harmful to a baby.  It is best to take as little medicine as possible while pregnant. However, some medicines are necessary to take for the  mother and baby's health. Sometimes, it is more dangerous to stop taking certain medicines than to stay on them. This is often the case for people with long-term (chronic) conditions such as asthma, diabetes, or high blood pressure (hypertension). If you are pregnant and have a chronic illness, call your caregiver right away. Bring a list of your medicines and their doses to your appointments. If you are planning to become pregnant, schedule a doctor's appointment and discuss your medicines with your caregiver. Lastly, write down the phone number to your pharmacist. They can answer questions regarding a medicine's class and safety. They cannot give advice as to whether you should or should not be on a medicine.  SAFE AND UNSAFE MEDICINES There is a long list of medicines that are considered safe in pregnancy. Below is a shorter list. For specific medicines, ask your caregiver.  AllergyMedicines Loratadine, cetirizine, and chlorpheniramine are safe to take. Certain nasal steroid sprays are safe. Talk to your caregiver about specific brands that are safe. Analgesics Acetaminophen and acetaminophen with codeine are safe to take. All other nonsteroidal anti-inflammatory drugs (NSAIDS) are not safe. This includes ibuprofen.  Antacids Many over-the-counter antacids are safe to take. Talk to your caregiver about specific brands that are safe. Famotidine, ranitidine, and lansoprazole are safe. Omepresole is considered safe to take in the second trimester. Antibiotic Medicines There are several antibiotics to avoid. These include, but are not limited to, tetracyline, quinolones, and sulfa medications. Talk to your caregiver before taking any antibiotic.  Antihistamines Talk to your caregiver about specific brands that are safe.  Asthma Medicines Most asthma steroid inhalers are safe to take. Talk to your caregiver for specific details. Calcium Calcium supplements are safe to take. Do not take oyster  shell calcium.  Cough and Cold Medicines It is safe to take products with guaifenesin or dextromethorphan. Talk to your caregiver about specific brands that are safe. It is not safe to take products that contain aspirin or ibuprofen. Decongestant Medicines Pseudoephedrine-containing products are safe to take in the second and third trimester.  Depression Medicines Talk about these medicines with your caregiver.  Antidiarrheal Medicines It is safe to take loperamide. Talk to your caregiver about specific brands that are safe. It is not safe to take any antidiarrheal medicine that  contains bismuth. Eyedrops Allergy eyedrops should be limited.  Iron It is safe to use certain iron-containing medicines for anemia in pregnancy. They require a prescription.  Antinausea Medicines It is safe to take doxylamine and vitamin B6 as directed. There are other prescription medicines available, if needed.  Sleep aids It is safe to take diphenhydramine and acetaminophen with diphenhydramine.  Steroids Hydrocortisone creams are safe to use as directed. Oral steroids require a prescription. It is not safe to take any hemorrhoid cream with pramoxine or phenylephrine. Stool softener It is safe to take stool softener medicines. Avoid daily or prolonged use of stool softeners. Thyroid Medicine It is important to stay on this thyroid medicine. It needs to be followed by your caregiver.  Vaginal Medicines Your caregiver will prescribe a medicine to you if you have a vaginal infection. Certain antifungal medicines are safe to use if you have a sexually transmitted infection (STI). Talk to your caregiver.  Document Released: 06/30/2005 Document Revised: 09/22/2011 Document Reviewed: 07/01/2011 [facility] Patient Information 2015 [place], [place]. This information is not intended to replace advice given to you by your health care provider. Make sure you discuss any questions you have with your health care  provider.  Sexually Transmitted Disease A sexually transmitted disease (STD) is a disease or infection that may be passed (transmitted) from person to person, usually during sexual activity. This may happen by way of saliva, semen, blood, vaginal mucus, or urine. Common STDs include:   Gonorrhea.   Chlamydia.   Syphilis.   HIV and AIDS.   Genital herpes.   Hepatitis B and C.   Trichomonas.   Human papillomavirus (HPV).   Pubic lice.   Scabies.  Mites.  Bacterial vaginosis. WHAT ARE CAUSES OF STDs? An STD may be caused by bacteria, a virus, or parasites. STDs are often transmitted during sexual activity if one person is infected. However, they may also be transmitted through nonsexual means. STDs may be transmitted after:   Sexual intercourse with an infected person.   Sharing sex toys with an infected person.   Sharing needles with an infected person or using unclean piercing or tattoo needles.  Having intimate contact with the genitals, mouth, or rectal areas of an infected person.   Exposure to infected fluids during birth. WHAT ARE THE SIGNS AND SYMPTOMS OF STDs? Different STDs have different symptoms. Some people may not have any symptoms. If symptoms are present, they may include:   Painful or bloody urination.   Pain in the pelvis, abdomen, vagina, anus, throat, or eyes.   A skin rash, itching, or irritation.  Growths, ulcerations, blisters, or sores in the genital and anal areas.  Abnormal vaginal discharge with or without bad odor.   Penile discharge in men.   Fever.   Pain or bleeding during sexual intercourse.   Swollen glands in the groin area.   Yellow skin and eyes (jaundice). This is seen with hepatitis.   Swollen testicles.  Infertility.  Sores and blisters in the mouth. HOW ARE STDs DIAGNOSED? To make a diagnosis, your health care provider may:   Take a medical history.   Perform a physical exam.   Take a  sample of any discharge to examine.  Swab the throat, cervix, opening to the penis, rectum, or vagina for testing.  Test a sample of your first morning urine.   Perform blood tests.   Perform a Pap test, if this applies.   Perform a colposcopy.   Perform a laparoscopy.  HOW ARE STDs TREATED? Treatment depends on the STD. Some STDs may be treated but not cured.   Chlamydia, gonorrhea, trichomonas, and syphilis can be cured with antibiotic medicine.   Genital herpes, hepatitis, and HIV can be treated, but not cured, with prescribed medicines. The medicines lessen symptoms.   Genital warts from HPV can be treated with medicine or by freezing, burning (electrocautery), or surgery. Warts may come back.   HPV cannot be cured with medicine or surgery. However, abnormal areas may be removed from the cervix, vagina, or vulva.   If your diagnosis is confirmed, your recent sexual partners need treatment. This is true even if they are symptom-free or have a negative culture or evaluation. They should not have sex until their health care providers say it is okay. HOW CAN I REDUCE MY RISK OF GETTING AN STD? Take these steps to reduce your risk of getting an STD:  Use latex condoms, dental dams, and water-soluble lubricants during sexual activity. Do not use petroleum jelly or oils.  Avoid having multiple sex partners.  Do not have sex with someone who has other sex partners.  Do not have sex with anyone you do not know or who is at high risk for an STD.  Avoid risky sex practices that can break your skin.  Do not have sex if you have open sores on your mouth or skin.  Avoid drinking too much alcohol or taking illegal drugs. Alcohol and drugs can affect your judgment and put you in a vulnerable position.  Avoid engaging in oral and anal sex acts.  Get vaccinated for HPV and hepatitis. If you have not received these vaccines in the past, talk to your health care provider about  whether one or both might be right for you.   If you are at risk of being infected with HIV, it is recommended that you take a prescription medicine daily to prevent HIV infection. This is called pre-exposure prophylaxis (PrEP). You are considered at risk if:  You are a man who has sex with other men (MSM).  You are a heterosexual man or woman and are sexually active with more than one partner.  You take drugs by injection.  You are sexually active with a partner who has HIV.  Talk with your health care provider about whether you are at high risk of being infected with HIV. If you choose to begin PrEP, you should first be tested for HIV. You should then be tested every 3 months for as long as you are taking PrEP.  WHAT SHOULD I DO IF I THINK I HAVE AN STD?  See your health care provider.   Tell your sexual partner(s). They should be tested and treated for any STDs.  Do not have sex until your health care provider says it is okay. WHEN SHOULD I GET IMMEDIATE MEDICAL CARE? Contact your health care provider right away if:   You have severe abdominal pain.  You are a man and notice swelling or pain in your testicles.  You are a woman and notice swelling or pain in your vagina. Document Released: 09/20/2002 Document Revised: 07/05/2013 Document Reviewed: 01/18/2013 [facility] Patient Information 2015 [place], [place]. This information is not intended to replace advice given to you by your health care provider. Make sure you discuss any questions you have with your health care provider.

## 2014-08-30 NOTE — ED Notes (Signed)
Assisted Mercedes with Pelvic @19 :58

## 2014-08-30 NOTE — ED Provider Notes (Signed)
CSN: [id number]     Arrival date & time 08/30/14  1551 History   First MD Initiated Contact with Patient 08/30/14 1823     Chief Complaint  Patient presents with  . Emesis  . Hematemesis     (Consider location/radiation/quality/duration/timing/severity/associated sxs/prior Treatment) HPI Comments: Kelly Avila is a 34 y.o. G3P1 female currently 6w EGA (per patient, unknown exact LMP but states she thinks "end of January/beginning of February 2016"), with a PSHx of ectopic pregnancy surgery, who presents to the ED with complaints of nausea and vomiting 2 days with intermittent streaks of bright red blood in the emesis. She reports that she's not sure of the exact number of episodes of emesis daily, but states that it's approximately 10 times per day, and she is unable to tolerate liquids or solids due to her nausea. She's not attempted any medications at home, she was given Zofran in triage with significant relief to her nausea. She reports that today she also developed lower abdominal pain which she describes as 6/10 intermittent sharp nonradiating pain with no known aggravating factors, and no medications tried prior to arrival. She denies any fevers, chills, chest pain, shortness breath, diarrhea, constipation, melena, hematochezia, coffee ground emesis, dysuria, hematuria, vaginal bleeding or discharge, numbness, tingling, weakness, lightheadedness, dizziness, vision changes, or headaches. Denies recent travel or sick contacts, no suspicious food intake. No NSAID use. Has not established prenatal care yet. Sexually active with one female partner unprotected.  Patient is a 34 y.o. female presenting with vomiting. The history is provided by the patient. No language interpreter was used.  Emesis Severity:  Moderate Duration:  2 days Timing:  Constant Number of daily episodes:  ~10x/day but unsure of exact number Quality:  Stomach contents and bright red blood (scant streaks of bright red blood  intermittently) Progression:  Unchanged Chronicity:  New Recent urination:  Normal Relieved by:  None tried Worsened by:  Liquids and food smell Ineffective treatments:  None tried Associated symptoms: abdominal pain   Associated symptoms: no arthralgias, no chills, no cough, no diarrhea, no fever, no headaches, no myalgias and no sore throat   Abdominal pain:    Location:  Suprapubic, RLQ and LLQ   Quality:  Sharp   Severity:  Moderate (6/10)   Onset quality:  Gradual   Duration:  1 day   Timing:  Intermittent   Progression:  Unchanged   Chronicity:  New Risk factors: pregnant now   Risk factors: no sick contacts, no suspect food intake and no travel to endemic areas     History reviewed. No pertinent past medical history. Past Surgical History  Procedure Laterality Date  . Ectopic pregnancy surgery     History reviewed. No pertinent family history. History  Substance Use Topics  . Smoking status: Current Every Day Smoker -- 0.50 packs/day  . Smokeless tobacco: Not on file  . Alcohol Use: Yes   OB History    No data available     Review of Systems  Constitutional: Negative for fever and chills.  HENT: Negative for sore throat.   Eyes: Negative for visual disturbance.  Respiratory: Negative for shortness of breath.   Cardiovascular: Negative for chest pain.  Gastrointestinal: Positive for nausea, vomiting and abdominal pain. Negative for diarrhea, constipation and blood in stool.  Genitourinary: Negative for dysuria, frequency, hematuria, flank pain, vaginal bleeding and vaginal discharge.  Musculoskeletal: Negative for myalgias, back pain and arthralgias.  Skin: Negative for color change.  Allergic/Immunologic: Negative for immunocompromised state.  Neurological: Negative for dizziness, syncope, weakness, light-headedness (now resolved, some intermittently before), numbness and headaches.  Hematological: Does not bruise/bleed easily.  Psychiatric/Behavioral:  Negative for confusion.   10 Systems reviewed and are negative for acute change except as noted in the HPI.    Allergies  Review of patient's allergies indicates no known allergies.  Home Medications   Prior to Admission medications   Medication Sig Start Date End Date Taking? Authorizing Provider  acetaminophen (TYLENOL) 500 MG tablet Take 500 mg by mouth every 6 (six) hours as needed for mild pain, moderate pain or headache.    Historical Provider, MD  naproxen (NAPROSYN) 500 MG tablet Take 1 tablet (500 mg total) by mouth 2 (two) times daily. 04/28/14   Tatyana A Kirichenko, PA-C  sulfamethoxazole-trimethoprim (SEPTRA DS) 800-160 MG per tablet Take 1 tablet by mouth every 12 (twelve) hours. 04/28/14   Tatyana A Kirichenko, PA-C   BP 126/75 mmHg  Pulse 70  Temp(Src) 98.7 F (37.1 C) (Oral)  Resp 16  SpO2 100%  LMP 07/14/2014 Physical Exam  Constitutional: She is oriented to person, place, and time. Vital signs are normal. She appears well-developed and well-nourished.  Non-toxic appearance. No distress.  Afebrile, nontoxic, NAD, VSS  HENT:  Head: Normocephalic and atraumatic.  Mouth/Throat: Oropharynx is clear and moist. Mucous membranes are dry (mildly).  Very mildly dry mucous membranes  Eyes: Conjunctivae and EOM are normal. Right eye exhibits no discharge. Left eye exhibits no discharge.  Neck: Normal range of motion. Neck supple.  Cardiovascular: Normal rate, regular rhythm, normal heart sounds and intact distal pulses.  Exam reveals no gallop and no friction rub.   No murmur heard. Pulmonary/Chest: Effort normal and breath sounds normal. No respiratory distress. She has no decreased breath sounds. She has no wheezes. She has no rhonchi. She has no rales.  Abdominal: Soft. Normal appearance and bowel sounds are normal. She exhibits no distension. There is tenderness in the right lower quadrant, suprapubic area and left lower quadrant. There is no rigidity, no rebound, no  guarding, no CVA tenderness, no tenderness at McBurney's point and negative Murphy's sign.    Soft, ND, +BS throughout, with mild TTP in RLQ/suprapubic/LLQ areas as well as some epigastric TTP, no r/g/r, neg murphy's, neg mcburney's, no CVA TTP   Genitourinary: Uterus normal. Pelvic exam was performed with patient supine. There is no rash, tenderness or lesion on the right labia. There is no rash, tenderness or lesion on the left labia. Cervix exhibits discharge. Cervix exhibits no motion tenderness and no friability. Right adnexum displays tenderness. Right adnexum displays no mass and no fullness. Left adnexum displays tenderness. Left adnexum displays no mass and no fullness. No erythema, tenderness or bleeding in the vagina. Vaginal discharge found.  Chaperone present for exam. No rashes, lesions, or tenderness to external genitalia. No erythema, injury, or tenderness to vaginal mucosa. Thick mucoid vaginal discharge in vaginal vault, no bleeding within vaginal vault. No adnexal masses or fullness, mild b/l tenderness. No CMT or cervical friability, thick mucoid discharge from cervical os which is closed. Uterus non-deviated, mobile, nonTTP, and without enlargement.    Musculoskeletal: Normal range of motion.  MAE x4 Strength and sensation grossly intact Distal pulses intact  Neurological: She is alert and oriented to person, place, and time. She has normal strength. No sensory deficit.  Skin: Skin is warm, dry and intact. No rash noted.  Psychiatric: She has a normal mood and affect.  Nursing note and vitals reviewed.  ED Course  Procedures (including critical care time) Labs Review Labs Reviewed  WET PREP, GENITAL - Abnormal; Notable for the following:    Trich, Wet Prep MODERATE (*)    WBC, Wet Prep HPF POC TOO NUMEROUS TO COUNT (*)    All other components within normal limits  COMPREHENSIVE METABOLIC PANEL - Abnormal; Notable for the following:    Sodium 133 (*)    GFR calc non Af  Amer 85 (*)    All other components within normal limits  URINALYSIS, ROUTINE W REFLEX MICROSCOPIC - Abnormal; Notable for the following:    Color, Urine AMBER (*)    APPearance CLOUDY (*)    Specific Gravity, Urine 1.033 (*)    Hgb urine dipstick TRACE (*)    Bilirubin Urine SMALL (*)    Ketones, ur 40 (*)    Protein, ur 30 (*)    Leukocytes, UA LARGE (*)    All other components within normal limits  HCG, QUANTITATIVE, PREGNANCY - Abnormal; Notable for the following:    hCG, Beta Chain, [place] LongestQuant, S [id number] (*)    All other components within normal limits  URINE MICROSCOPIC-ADD ON - Abnormal; Notable for the following:    Squamous Epithelial / LPF MANY (*)    Bacteria, UA FEW (*)    All other components within normal limits  URINE CULTURE  CBC WITH DIFFERENTIAL/PLATELET  LIPASE, BLOOD  RPR  HIV ANTIBODY (ROUTINE TESTING)  ABO/RH  GC/CHLAMYDIA PROBE AMP (False Pass)    Imaging Review [place] Ob Comp Less 14 Wks  08/30/2014   CLINICAL DATA:  Pregnant patient with abdominal pain and history of ectopic.  EXAM: OBSTETRIC <14 WK US AND TRANSVAGINAL OB US  TECHNIQUE: Both transabdominal and transvaginal ultrasound examinations were performed for complete evaluation of the gestation as well as the maternal uterus, adnexal regions, and pelvic cul-de-sac. Transvaginal technique was performed to assess early pregnancy.  COMPARISON:  None.  FINDINGS: Intrauterine gestational sac: Visualized/normal in shape.  Yolk sac:  Visualized.  Embryo:  Visualized.  Cardiac Activity: Detected.  Heart Rate: 168  bpm  CRL:  17.7  cm   8 w   2 d                  US EDC: 04/09/2015  Maternal uterus/adnexae: The left ovary has been removed. The right ovary is unremarkable.  IMPRESSION: Single living intrauterine pregnancy.  No acute finding.   Electronically Signed   By: Drusilla Kannerhomas  Dalessio M.D.   On: 08/30/2014 21:21   [place] Ob Transvaginal  08/30/2014   CLINICAL DATA:  Pregnant patient with abdominal pain and history of  ectopic.  EXAM: OBSTETRIC <14 WK US AND TRANSVAGINAL OB US  TECHNIQUE: Both transabdominal and transvaginal ultrasound examinations were performed for complete evaluation of the gestation as well as the maternal uterus, adnexal regions, and pelvic cul-de-sac. Transvaginal technique was performed to assess early pregnancy.  COMPARISON:  None.  FINDINGS: Intrauterine gestational sac: Visualized/normal in shape.  Yolk sac:  Visualized.  Embryo:  Visualized.  Cardiac Activity: Detected.  Heart Rate: 168  bpm  CRL:  17.7  cm   8 w   2 d                  US EDC: 04/09/2015  Maternal uterus/adnexae: The left ovary has been removed. The right ovary is unremarkable.  IMPRESSION: Single living intrauterine pregnancy.  No acute finding.   Electronically Signed   By: Drusilla Kannerhomas  Dalessio M.D.  On: 08/30/2014 21:21     EKG Interpretation None      MDM   Final diagnoses:  Abdominal pain in pregnancy  Hyperemesis gravidarum  Hematemesis with nausea  Trichomonal vaginitis  Normal IUP (intrauterine pregnancy) on prenatal ultrasound, first trimester    34 y.o. female with N/V x2 days, [redacted] wks pregnant per her home preg test, seems c/w hyperemesis gravidarum. VS stable, no lightheadedness or syncope, doesn't appear clinically severely dehydrated but does have slightly dry mucous membranes. Given zofran and feels better, will give PO fluids. Hematemesis described seems more likely from possible mallory weiss tears since it's bright red streaks intermittently, no coffee grounds, doubt upper GI bleed or obstruction. CBC WNL, CMP with mildly low sodium. Has some lower abd pain diffusely, as well as mild epigastric pain. Will get STD labs, lipase, U/A, quant HCG, OB U/S to eval for IUP, and get pelvic exam. Will hold on any pain meds for now, pt agrees with this plan. Will give protonix as well.  7:58 PM Pelvic exam showing mucoid discharge, will empirically treat for GC/CT. Awaiting U/S and U/A, and quant HCG. Lipase WNL.  ABO/Rh showing O+ blood type, no rhogam needed.   10:17 PM U/A with trichomonas, will treat with metrogel. Advised pt to get partners tested and treated. U/A doesn't appear to be UTI since few bacteria present, but will send for culture. Wet prep again showing trich and TNTC WBC. Empiric GC/CT meds given here. Quant HCG [id number]. HIV/RPR in process. U/S showing single IUP at [redacted]w[redacted]d which is pretty consistent with estimated LMP. Likely that trichomonas is causing her lower abd pain. Pt tolerating PO well here. Discussed that zofran still has some risks in early pregnancy and therefore discussed diet modifications to help with hyperemesis gravidarum, and Diclegis use. Discussed staying hydrated and taking prenatal vitamins. Will send home with short course of prilosec to help protect GI tract since she seems to have had hematemesis likely from mallory weiss tear. Will have her f/up with women's clinic or her OBGYN, doubt need for GI follow up at this time. I explained the diagnosis and have given explicit precautions to return to the ER including for any other new or worsening symptoms. The patient understands and accepts the medical plan as it's been dictated and I have answered their questions. Discharge instructions concerning home care and prescriptions have been given. The patient is STABLE and is discharged to home in good condition.  BP 101/62 mmHg  Pulse 71  Temp(Src) 98.3 F (36.8 C) (Oral)  Resp 16  SpO2 100%  LMP 07/14/2014  Meds ordered this encounter  Medications  . ondansetron (ZOFRAN-ODT) disintegrating tablet 8 mg    Sig:   . ondansetron (ZOFRAN-ODT) 4 MG disintegrating tablet    Sig:     Pate, K[place]   : cabinet override  . sodium chloride 0.9 % bolus 1,000 mL    Sig:   . pantoprazole (PROTONIX) injection 40 mg    Sig:   . azithromycin (ZITHROMAX) tablet 1,000 mg    Sig:    And  . cefTRIAXone (ROCEPHIN) injection 250 mg    Sig:     Order Specific Question:  Antibiotic  Indication:    Answer:  STD  . lidocaine (PF) (XYLOCAINE) 1 % injection    Sig:     [place], Kuch   : cabinet override  . lidocaine (PF) (XYLOCAINE) 1 % injection 0.9 mL    Sig:   . Doxylamine-Pyridoxine 10-10 MG TBEC  Sig: 2 tablets on an empty stomach at bedtime on day 1 and 2. If symptoms persist, take 1 tablet in the morning and 2 tablets at bedtime on day 3. If symptoms persist, increase to 1 tablet in the morning, 1 tablet in the afternoon, and 2 tablets at bedtime daily.    Dispense:  60 tablet    Refill:  0    Order Specific Question:  Supervising Provider    Answer:  Eber Hong D [[id number]]  . omeprazole (PRILOSEC) 20 MG capsule    Sig: Take 1 capsule (20 mg total) by mouth daily. X 5 days    Dispense:  5 capsule    Refill:  0    Order Specific Question:  Supervising Provider    Answer:  Eber Hong D [[id number]]  . metroNIDAZOLE (METROGEL VAGINAL) 0.75 % vaginal gel    Sig: Place 1 Applicatorful vaginally 2 (two) times daily. X 5 days    Dispense:  70 g    Refill:  0    Order Specific Question:  Supervising Provider    Answer:  Vida Roller [address] [place], PA-C 08/30/14 2223  Donnetta Hutching, MD 08/30/14 [phone]

## 2014-08-30 NOTE — ED Notes (Signed)
Chaperoned pt at ultrasound.

## 2014-08-31 LAB — RPR: RPR: NONREACTIVE

## 2014-08-31 LAB — GC/CHLAMYDIA PROBE AMP (~~LOC~~) NOT AT ARMC
Chlamydia: NEGATIVE
Neisseria Gonorrhea: NEGATIVE

## 2014-08-31 LAB — HIV ANTIBODY (ROUTINE TESTING W REFLEX): HIV SCREEN 4TH GENERATION: NONREACTIVE

## 2014-09-01 LAB — URINE CULTURE: Special Requests: NORMAL

## 2014-10-31 LAB — CULTURE, OB URINE: Urine Culture, OB: NO GROWTH

## 2014-10-31 LAB — OB RESULTS CONSOLE RUBELLA ANTIBODY, IGM: Rubella: IMMUNE

## 2014-10-31 LAB — OB RESULTS CONSOLE HEPATITIS B SURFACE ANTIGEN: Hepatitis B Surface Ag: NEGATIVE

## 2014-10-31 LAB — OB RESULTS CONSOLE VARICELLA ZOSTER ANTIBODY, IGG: Varicella: IMMUNE

## 2014-10-31 LAB — OB RESULTS CONSOLE GC/CHLAMYDIA
Chlamydia: NEGATIVE
GC PROBE AMP, GENITAL: NEGATIVE
Gonorrhea: NEGATIVE

## 2014-10-31 LAB — OB RESULTS CONSOLE HIV ANTIBODY (ROUTINE TESTING): HIV: NONREACTIVE

## 2014-10-31 LAB — OB RESULTS CONSOLE ABO/RH: RH Type: POSITIVE

## 2014-10-31 LAB — OB RESULTS CONSOLE RPR: RPR: NONREACTIVE

## 2014-10-31 LAB — OB RESULTS CONSOLE HGB/HCT, BLOOD
HEMATOCRIT: 36 %
HEMOGLOBIN: 11.7 g/dL

## 2014-10-31 LAB — CYSTIC FIBROSIS DIAGNOSTIC STUDY: Interpretation-CFDNA:: NEGATIVE

## 2014-10-31 LAB — OB RESULTS CONSOLE ANTIBODY SCREEN: Antibody Screen: NEGATIVE

## 2014-10-31 LAB — CYTOLOGY - PAP

## 2014-10-31 LAB — OB RESULTS CONSOLE PLATELET COUNT: Platelets: 195 10*3/uL

## 2014-11-07 LAB — AFP MATERNAL TRIPLE SCREEN: AFP, Serum: NEGATIVE

## 2014-12-05 LAB — DRUG SCREEN, URINE: Drug Screen, Urine: POSITIVE

## 2015-03-22 ENCOUNTER — Encounter: Payer: Self-pay | Admitting: *Deleted

## 2015-04-03 ENCOUNTER — Inpatient Hospital Stay (HOSPITAL_COMMUNITY)
Admission: AD | Admit: 2015-04-03 | Discharge: 2015-04-03 | Disposition: A | Discharge status: Home / Self Care | Payer: Medicaid Other | Source: Ambulatory Visit | Attending: Obstetrics & Gynecology | Admitting: Obstetrics & Gynecology

## 2015-04-03 ENCOUNTER — Encounter (HOSPITAL_COMMUNITY): Payer: Self-pay

## 2015-04-03 DIAGNOSIS — O479 False labor, unspecified: Secondary | ICD-10-CM

## 2015-04-03 DIAGNOSIS — Z87891 Personal history of nicotine dependence: Secondary | ICD-10-CM | POA: Diagnosis not present

## 2015-04-03 DIAGNOSIS — O0933 Supervision of pregnancy with insufficient antenatal care, third trimester: Secondary | ICD-10-CM

## 2015-04-03 DIAGNOSIS — Z3A38 38 weeks gestation of pregnancy: Secondary | ICD-10-CM | POA: Diagnosis not present

## 2015-04-03 DIAGNOSIS — O23591 Infection of other part of genital tract in pregnancy, first trimester: Secondary | ICD-10-CM

## 2015-04-03 DIAGNOSIS — Z9079 Acquired absence of other genital organ(s): Secondary | ICD-10-CM

## 2015-04-03 DIAGNOSIS — A5901 Trichomonal vulvovaginitis: Secondary | ICD-10-CM | POA: Clinically undetermined

## 2015-04-03 DIAGNOSIS — R109 Unspecified abdominal pain: Secondary | ICD-10-CM | POA: Diagnosis present

## 2015-04-03 LAB — URINALYSIS, ROUTINE W REFLEX MICROSCOPIC
Bilirubin Urine: NEGATIVE
GLUCOSE, UA: NEGATIVE mg/dL
Hgb urine dipstick: NEGATIVE
Ketones, ur: 15 mg/dL — AB
Nitrite: NEGATIVE
PH: 6 (ref 5.0–8.0)
Protein, ur: NEGATIVE mg/dL
Specific Gravity, Urine: 1.02 (ref 1.005–1.030)
Urobilinogen, UA: 1 mg/dL (ref 0.0–1.0)

## 2015-04-03 LAB — URINE MICROSCOPIC-ADD ON

## 2015-04-03 MED ORDER — ACETAMINOPHEN 325 MG PO TABS
650.0000 mg | ORAL_TABLET | Freq: Once | ORAL | Status: AC
  Administered 2015-04-03: 650 mg via ORAL
  Filled 2015-04-03: qty 2

## 2015-04-03 NOTE — MAU Provider Note (Signed)
History     CSN: [id number]  Arrival date and time: 04/03/15 1502   First Provider Initiated Contact with Patient 04/03/15 1544      Chief Complaint  Patient presents with  . Abdominal Pain  . Back Pain   HPI Pt is G2P1001 at [redacted]w[redacted]d pregnant who presents with right mid abd pain radiating to her back that comes and goes. Pt having an occasional ctx. Pt was at work working at [employer] this morning when pain started- the pain comes and goes- pt has not tried any pain relieving measures. Pt denies pain with urination, constipation, diarrhea, headache, LOF or vaginal bleeding Baby is active Pt states she has not had any complications with her pregnancy and is going downstairs to clinic for care  History reviewed. No pertinent past medical history.  Past Surgical History  Procedure Laterality Date  . Ectopic pregnancy surgery      History reviewed. No pertinent family history.  Social History  Substance Use Topics  . Smoking status: Former Smoker -- 0.50 packs/day  . Smokeless tobacco: None  . Alcohol Use: No    Allergies: No Known Allergies  Prescriptions prior to admission  Medication Sig Dispense Refill Last Dose  . acetaminophen (TYLENOL) 500 MG tablet Take 500 mg by mouth every 6 (six) hours as needed for mild pain, moderate pain or headache.   04/28/2014 at Unknown time  . Doxylamine-Pyridoxine 10-10 MG TBEC 2 tablets on an empty stomach at bedtime on day 1 and 2. If symptoms persist, take 1 tablet in the morning and 2 tablets at bedtime on day 3. If symptoms persist, increase to 1 tablet in the morning, 1 tablet in the afternoon, and 2 tablets at bedtime daily. 60 tablet 0   . metroNIDAZOLE (METROGEL VAGINAL) 0.75 % vaginal gel Place 1 Applicatorful vaginally 2 (two) times daily. X 5 days 70 g 0   . naproxen (NAPROSYN) 500 MG tablet Take 1 tablet (500 mg total) by mouth 2 (two) times daily. 30 tablet 0   . omeprazole (PRILOSEC) 20 MG capsule Take 1 capsule (20 mg  total) by mouth daily. X 5 days 5 capsule 0   . sulfamethoxazole-trimethoprim (SEPTRA DS) 800-160 MG per tablet Take 1 tablet by mouth every 12 (twelve) hours. 10 tablet 0     Review of Systems  Constitutional: Negative for fever and chills.  Gastrointestinal: Positive for nausea and abdominal pain. Negative for vomiting, diarrhea and constipation.  Genitourinary: Negative for dysuria, urgency and flank pain.  Musculoskeletal: Negative for myalgias and neck pain.  Neurological: Negative for headaches.   Physical Exam   Blood pressure 112/59, pulse 88, temperature 97.9 F (36.6 C), temperature source Oral, resp. rate 18, height  (1.702 m), weight 188 lb 6.4 oz (85.458 kg), last menstrual period 07/28/2014.  Physical Exam  Nursing note and vitals reviewed. Constitutional: She is oriented to person, place, and time. She appears well-developed and well-nourished. No distress.  HENT:  Head: Normocephalic.  Eyes: Pupils are equal, round, and reactive to light.  Neck: Normal range of motion. Neck supple.  Cardiovascular: Normal rate.   Respiratory: Effort normal.  No CVA tenderness  GI: Soft. She exhibits no distension. There is no tenderness. There is no rebound and no guarding.  FHR 145 bpm reactive strip  Musculoskeletal: Normal range of motion.  Neurological: She is alert and oriented to person, place, and time.  Skin: Skin is warm and dry.  Psychiatric: She has a normal mood and affect.  MAU Course  Procedures Results for orders placed or performed during the hospital encounter of 04/03/15 (from the past 24 hour(s))  Urinalysis, Routine w reflex microscopic (not at [facility])     Status: Abnormal   Collection Time: 04/03/15  3:20 PM  Result Value Ref Range   Color, Urine YELLOW YELLOW   APPearance CLEAR CLEAR   Specific Gravity, Urine 1.020 1.005 - 1.030   pH 6.0 5.0 - 8.0   Glucose, UA NEGATIVE NEGATIVE mg/dL   Hgb urine dipstick NEGATIVE NEGATIVE   Bilirubin Urine  NEGATIVE NEGATIVE   Ketones, ur 15 (A) NEGATIVE mg/dL   Protein, ur NEGATIVE NEGATIVE mg/dL   Urobilinogen, UA 1.0 0.0 - 1.0 mg/dL   Nitrite NEGATIVE NEGATIVE   Leukocytes, UA SMALL (A) NEGATIVE  Urine microscopic-add on     Status: None   Collection Time: 04/03/15  3:20 PM  Result Value Ref Range   Squamous Epithelial / LPF RARE RARE   WBC, UA 0-2 <3 WBC/hpf   RBC / HPF 0-2 <3 RBC/hpf   Bacteria, UA RARE RARE  care turned over to Wynelle Bourgeois, CNM   I interviewed patient and obtained a history and review of systems Medical, Surgical, Family and Social histories reviewed and are listed above.  Medications and allergies reviewed.   Contractions and RLQ pain are now gone Denies bleeding Reports + FM I checked her cervix and found it to be 2-3/70%/-2/vertex I obtained cultures and wet prep and GBS just incase she goes into labor before New OB visit Has PNV Has appt Monday in clinic Aviva Signs, CNM  Assessment and Plan  A:  SIUP at [redacted]w[redacted]d        Reactive nonstress test, Cat I       Irregular contractions.       Prodromal contractions       History of Trich in first trimester       Hx MJ use        P:  Discharge home       Reviewed signs of things to come to ER/MAU for.       Labor precautions       If not delivered, follow up in MAU on Monday 04/09/15 as scheduled         LINEBERRY,SUSAN 04/03/2015, 3:45 PM

## 2015-04-03 NOTE — MAU Note (Signed)
Pain in RLQ and Rt flank.  Started this afternoon, comes and goes, tightens then lets goes- started in abd then back started hurting.

## 2015-04-03 NOTE — Discharge Instructions (Signed)
Third Trimester of Pregnancy The third trimester is from week 29 through week 42, months 7 through 9. The third trimester is a time when the fetus is growing rapidly. At the end of the ninth month, the fetus is about 20 inches in length and weighs 6-10 pounds.  BODY CHANGES Your body goes through many changes during pregnancy. The changes vary from woman to woman.   Your weight will continue to increase. You can expect to gain 25-35 pounds (11-16 kg) by the end of the pregnancy.  You may begin to get stretch marks on your hips, abdomen, and breasts.  You may urinate more often because the fetus is moving lower into your pelvis and pressing on your bladder.  You may develop or continue to have heartburn as a result of your pregnancy.  You may develop constipation because certain hormones are causing the muscles that push waste through your intestines to slow down.  You may develop hemorrhoids or swollen, bulging veins (varicose veins).  You may have pelvic pain because of the weight gain and pregnancy hormones relaxing your joints between the bones in your pelvis. Backaches may result from overexertion of the muscles supporting your posture.  You may have changes in your hair. These can include thickening of your hair, rapid growth, and changes in texture. Some women also have hair loss during or after pregnancy, or hair that feels dry or thin. Your hair will most likely return to normal after your baby is born.  Your breasts will continue to grow and be tender. A yellow discharge may leak from your breasts called colostrum.  Your belly button may stick out.  You may feel short of breath because of your expanding uterus.  You may notice the fetus "dropping," or moving lower in your abdomen.  You may have a bloody mucus discharge. This usually occurs a few days to a week before labor begins.  Your cervix becomes thin and soft (effaced) near your due date. WHAT TO EXPECT AT YOUR PRENATAL  EXAMS  You will have prenatal exams every 2 weeks until week 36. Then, you will have weekly prenatal exams. During a routine prenatal visit:  You will be weighed to make sure you and the fetus are growing normally.  Your blood pressure is taken.  Your abdomen will be measured to track your baby's growth.  The fetal heartbeat will be listened to.  Any test results from the previous visit will be discussed.  You may have a cervical check near your due date to see if you have effaced. At around 36 weeks, your caregiver will check your cervix. At the same time, your caregiver will also perform a test on the secretions of the vaginal tissue. This test is to determine if a type of bacteria, Group B streptococcus, is present. Your caregiver will explain this further. Your caregiver may ask you:  What your birth plan is.  How you are feeling.  If you are feeling the baby move.  If you have had any abnormal symptoms, such as leaking fluid, bleeding, severe headaches, or abdominal cramping.  If you have any questions. Other tests or screenings that may be performed during your third trimester include:  Blood tests that check for low iron levels (anemia).  Fetal testing to check the health, activity level, and growth of the fetus. Testing is done if you have certain medical conditions or if there are problems during the pregnancy. FALSE LABOR You may feel small, irregular contractions that   eventually go away. These are called Braxton Hicks contractions, or false labor. Contractions may last for hours, days, or even weeks before true labor sets in. If contractions come at regular intervals, intensify, or become painful, it is best to be seen by your caregiver.  SIGNS OF LABOR   Menstrual-like cramps.  Contractions that are 5 minutes apart or less.  Contractions that start on the top of the uterus and spread down to the lower abdomen and back.  A sense of increased pelvic pressure or back  pain.  A watery or bloody mucus discharge that comes from the vagina. If you have any of these signs before the 37th week of pregnancy, call your caregiver right away. You need to go to the hospital to get checked immediately. HOME CARE INSTRUCTIONS   Avoid all smoking, herbs, alcohol, and unprescribed drugs. These chemicals affect the formation and growth of the baby.  Follow your caregiver's instructions regarding medicine use. There are medicines that are either safe or unsafe to take during pregnancy.  Exercise only as directed by your caregiver. Experiencing uterine cramps is a good sign to stop exercising.  Continue to eat regular, healthy meals.  Wear a good support bra for breast tenderness.  Do not use hot tubs, steam rooms, or saunas.  Wear your seat belt at all times when driving.  Avoid raw meat, uncooked cheese, cat litter boxes, and soil used by cats. These carry germs that can cause birth defects in the baby.  Take your prenatal vitamins.  Try taking a stool softener (if your caregiver approves) if you develop constipation. Eat more high-fiber foods, such as fresh vegetables or fruit and whole grains. Drink plenty of fluids to keep your urine clear or pale yellow.  Take warm sitz baths to soothe any pain or discomfort caused by hemorrhoids. Use hemorrhoid cream if your caregiver approves.  If you develop varicose veins, wear support hose. Elevate your feet for 15 minutes, 3-4 times a day. Limit salt in your diet.  Avoid heavy lifting, wear low heal shoes, and practice good posture.  Rest a lot with your legs elevated if you have leg cramps or low back pain.  Visit your dentist if you have not gone during your pregnancy. Use a soft toothbrush to brush your teeth and be gentle when you floss.  A sexual relationship may be continued unless your caregiver directs you otherwise.  Do not travel far distances unless it is absolutely necessary and only with the approval  of your caregiver.  Take prenatal classes to understand, practice, and ask questions about the labor and delivery.  Make a trial run to the hospital.  Pack your hospital bag.  Prepare the baby's nursery.  Continue to go to all your prenatal visits as directed by your caregiver. SEEK MEDICAL CARE IF:  You are unsure if you are in labor or if your water has broken.  You have dizziness.  You have mild pelvic cramps, pelvic pressure, or nagging pain in your abdominal area.  You have persistent nausea, vomiting, or diarrhea.  You have a bad smelling vaginal discharge.  You have pain with urination. SEEK IMMEDIATE MEDICAL CARE IF:   You have a fever.  You are leaking fluid from your vagina.  You have spotting or bleeding from your vagina.  You have severe abdominal cramping or pain.  You have rapid weight loss or gain.  You have shortness of breath with chest pain.  You notice sudden or extreme swelling   of your face, hands, ankles, feet, or legs.  You have not felt your baby move in over an hour.  You have severe headaches that do not go away with medicine.  You have vision changes. Document Released: 06/24/2001 Document Revised: 07/05/2013 Document Reviewed: 08/31/2012 ExitCare Patient Information 2015 [employer]. This information is not intended to replace advice given to you by your health care provider. Make sure you discuss any questions you have with your health care provider.  

## 2015-04-03 NOTE — MAU Note (Signed)
Pt. States she recently moved here from [place]; has not had a doctor's appointment in 3 months.

## 2015-04-04 LAB — GC/CHLAMYDIA PROBE AMP (~~LOC~~) NOT AT ARMC
Chlamydia: NEGATIVE
Neisseria Gonorrhea: NEGATIVE

## 2015-04-05 LAB — CULTURE, BETA STREP (GROUP B ONLY): SPECIAL REQUESTS: NORMAL

## 2015-04-07 ENCOUNTER — Encounter (HOSPITAL_COMMUNITY): Payer: Self-pay | Admitting: *Deleted

## 2015-04-07 ENCOUNTER — Inpatient Hospital Stay (HOSPITAL_COMMUNITY)
Admission: AD | Admit: 2015-04-07 | Discharge: 2015-04-07 | Disposition: A | Discharge status: Home / Self Care | Payer: Medicaid Other | Source: Ambulatory Visit | Attending: Obstetrics and Gynecology | Admitting: Obstetrics and Gynecology

## 2015-04-07 HISTORY — DX: Other specified health status: Z78.9

## 2015-04-07 NOTE — Discharge Instructions (Signed)

## 2015-04-07 NOTE — MAU Note (Signed)
Pt presents to MAU with complaints of vaginal spotting that started 30 mins ago. Denies any LOF, mild contractions

## 2015-04-08 ENCOUNTER — Inpatient Hospital Stay (HOSPITAL_COMMUNITY)
Admission: AD | Admit: 2015-04-08 | Discharge: 2015-04-09 | DRG: 776 | Disposition: A | Discharge status: Home / Self Care | Payer: Medicaid Other | Source: Ambulatory Visit | Attending: Obstetrics and Gynecology | Admitting: Obstetrics and Gynecology

## 2015-04-08 ENCOUNTER — Encounter (HOSPITAL_COMMUNITY): Payer: Self-pay

## 2015-04-08 DIAGNOSIS — O23591 Infection of other part of genital tract in pregnancy, first trimester: Secondary | ICD-10-CM

## 2015-04-08 DIAGNOSIS — IMO0001 Reserved for inherently not codable concepts without codable children: Secondary | ICD-10-CM

## 2015-04-08 DIAGNOSIS — O0933 Supervision of pregnancy with insufficient antenatal care, third trimester: Secondary | ICD-10-CM

## 2015-04-08 DIAGNOSIS — A5901 Trichomonal vulvovaginitis: Secondary | ICD-10-CM

## 2015-04-08 DIAGNOSIS — F1290 Cannabis use, unspecified, uncomplicated: Secondary | ICD-10-CM | POA: Clinically undetermined

## 2015-04-08 LAB — CBC
HEMATOCRIT: 31.7 % — AB (ref 36.0–46.0)
HEMOGLOBIN: 10.4 g/dL — AB (ref 12.0–15.0)
MCH: 30.6 pg (ref 26.0–34.0)
MCHC: 32.8 g/dL (ref 30.0–36.0)
MCV: 93.2 fL (ref 78.0–100.0)
Platelets: 201 10*3/uL (ref 150–400)
RBC: 3.4 MIL/uL — AB (ref 3.87–5.11)
RDW: 13.6 % (ref 11.5–15.5)
WBC: 15.4 10*3/uL — ABNORMAL HIGH (ref 4.0–10.5)

## 2015-04-08 LAB — HEPATITIS B SURFACE ANTIGEN: HEP B S AG: NEGATIVE

## 2015-04-08 MED ORDER — SENNOSIDES-DOCUSATE SODIUM 8.6-50 MG PO TABS
2.0000 | ORAL_TABLET | ORAL | Status: DC
  Administered 2015-04-09: 2 via ORAL
  Filled 2015-04-08: qty 2

## 2015-04-08 MED ORDER — LANOLIN HYDROUS EX OINT: TOPICAL_OINTMENT | CUTANEOUS | Status: DC | PRN

## 2015-04-08 MED ORDER — ZOLPIDEM TARTRATE 5 MG PO TABS: 5.0000 mg | ORAL_TABLET | Freq: Every evening | ORAL | Status: DC | PRN

## 2015-04-08 MED ORDER — IBUPROFEN 600 MG PO TABS
600.0000 mg | ORAL_TABLET | Freq: Four times a day (QID) | ORAL | Status: DC
  Administered 2015-04-08 – 2015-04-09 (×7): 600 mg via ORAL
  Filled 2015-04-08 (×7): qty 1

## 2015-04-08 MED ORDER — DIPHENHYDRAMINE HCL 25 MG PO CAPS: 25.0000 mg | ORAL_CAPSULE | Freq: Four times a day (QID) | ORAL | Status: DC | PRN

## 2015-04-08 MED ORDER — PRENATAL MULTIVITAMIN CH
1.0000 | ORAL_TABLET | Freq: Every day | ORAL | Status: DC
  Administered 2015-04-08 – 2015-04-09 (×2): 1 via ORAL
  Filled 2015-04-08 (×3): qty 1

## 2015-04-08 MED ORDER — OXYCODONE-ACETAMINOPHEN 5-325 MG PO TABS: 1.0000 | ORAL_TABLET | ORAL | Status: DC | PRN

## 2015-04-08 MED ORDER — ONDANSETRON HCL 4 MG/2ML IJ SOLN: 4.0000 mg | INTRAMUSCULAR | Status: DC | PRN

## 2015-04-08 MED ORDER — TETANUS-DIPHTH-ACELL PERTUSSIS 5-2.5-18.5 LF-MCG/0.5 IM SUSP: 0.5000 mL | Freq: Once | INTRAMUSCULAR | Status: DC

## 2015-04-08 MED ORDER — ONDANSETRON HCL 4 MG PO TABS: 4.0000 mg | ORAL_TABLET | ORAL | Status: DC | PRN

## 2015-04-08 MED ORDER — ACETAMINOPHEN 325 MG PO TABS: 650.0000 mg | ORAL_TABLET | ORAL | Status: DC | PRN

## 2015-04-08 MED ORDER — WITCH HAZEL-GLYCERIN EX PADS: 1.0000 "application " | MEDICATED_PAD | CUTANEOUS | Status: DC | PRN

## 2015-04-08 MED ORDER — SIMETHICONE 80 MG PO CHEW
80.0000 mg | CHEWABLE_TABLET | ORAL | Status: DC | PRN
  Filled 2015-04-08: qty 1

## 2015-04-08 MED ORDER — BENZOCAINE-MENTHOL 20-0.5 % EX AERO
1.0000 "application " | INHALATION_SPRAY | CUTANEOUS | Status: DC | PRN
  Filled 2015-04-08: qty 56

## 2015-04-08 MED ORDER — OXYCODONE-ACETAMINOPHEN 5-325 MG PO TABS: 2.0000 | ORAL_TABLET | ORAL | Status: DC | PRN

## 2015-04-08 MED ORDER — DIBUCAINE 1 % RE OINT
1.0000 "application " | TOPICAL_OINTMENT | RECTAL | Status: DC | PRN
  Filled 2015-04-08: qty 28

## 2015-04-08 NOTE — MAU Note (Signed)
Pt arrived EMS after delivering at home.Pt a/a a/O holding baby.Placenta delivered at home as well. Small amount of vag bleeding noted. Fundus firm at Umbilicus V/SS. Notified  Zlatan Hornback of pt arrival..

## 2015-04-08 NOTE — Progress Notes (Signed)
Post Partum Day 1 Subjective:  Kelly Avila is a 34 y.o. G3P1011 [redacted]w[redacted]d s/p SVD at home.  No acute events overnight.  Pt denies problems with ambulating, voiding or po intake.  She denies nausea or vomiting.  Pain is well controlled.  She has had flatus. She has not had bowel movement.  Lochia Small.  Plan for birth control is oral contraceptives (estrogen/progesterone).  Method of Feeding: Bottle  Objective: Blood pressure 139/61, pulse 70, temperature 98.6 F (37 C), temperature source Oral, resp. rate 18, last menstrual period 07/28/2014, SpO2 100 %.  Physical Exam:  General: alert, cooperative and no distress Lochia:normal flow Chest: CTAB Heart: RRR no m/r/g Abdomen: +BS, soft, nontender,  Uterine Fundus: firm, below level of umbilicus DVT Evaluation: No evidence of DVT seen on physical exam. Extremities: No edema   Recent Labs  04/08/15 0040  HGB 10.4*  HCT 31.7*    Assessment/Plan:  ASSESSMENT: Kelly Avila is a 34 y.o. [id number] [redacted]w[redacted]d s/p SVD at home.  Plan for discharge tomorrow, Social Work consult and Contraception OCP   LOS: 0 days   Durenda Hurt 04/08/2015, 6:09 AM   Seen and examined by me also Agree with note Patient has done well, slept some last night. Minimal cramping Lochia small.  Baby doing well Plan D/C tomorrow Aviva Signs, CNM

## 2015-04-08 NOTE — Procedures (Signed)
Patient is 34 y.o. [id number] [redacted]w[redacted]d admitted SVD at home. Patient came in earlier today for a labor check but was discharged after no cervical change. At home, she had the overwhelming urge to push. Godmother called EMS but patient could not wait to push. 911 operator walked godmother through delivery and baby did well immediately after delivery but unable to obtain apgars. Placenta delivered at home spontaneously at home about 10-15 minutes after delivery. Per godmother, no significant bleeding, fluid was clear.   Delivery Note At 11:25 PM a viable female was delivered via Vaginal, Spontaneous Delivery (Presentation: unsure ).  APGAR: assigned by EMS, unkown; weight  pending Placenta status: spontaneous, delivered intact.  Cord: 3 vessels with the following complications: none.   Anesthesia: None  Episiotomy: None Lacerations:   Suture Repair: none Est. Blood Loss (mL):    Mom to postpartum.  Baby to Couplet care / Skin to Skin. NICU came to evaluate neonate in MAU and gave clearance to go to nursery.  Durenda Hurt 04/08/2015, 12:57 AM  Seen and examined by me also Agree with note There is a small superficial laceration at the midline perineum, not bleeding, not repaired Baby doing well according to Dr Francine Graven Plan normal PP care Labs and prenatal records reviewed in detail See [facility] that I started 4 days ago for details Aviva Signs, CNM

## 2015-04-08 NOTE — H&P (Signed)
OBSTETRIC ADMISSION HISTORY AND PHYSICAL  Kelly Avila is a 34 y.o. female G3P1011 with IUP at 102w6d by LMP c/w 1st trimester u/s presenting for SVD at home. Patient moved here about a month ago. She had prenatal care in [place] up until about a month ago, limited prenatal care in first trimester. She had no history of gHTN or gDM. Had marijuana use in pregnancy. She came in earlier today for labor check but made no cervical change so was sent home. Baby was delivered at home by godmother on the phone with EMS. Placenta delivered at home spontaneously about 10-15 minutes after delivery of neonate. She plans on bottle feeding. She request OCP for birth control.  Dating: By Duayne Cal --->  Estimated Date of Delivery: 04/09/15  Sono:    , CWD, normal anatomy  Prenatal History/Complications: Clinic No [facility] Prenatal Labs  Dating 8 week [place] in ED Blood type: O/Positive/-- (04/19 0000)   Genetic Screen Quad: Neg  Antibody:Negative (04/19 0000)  Anatomic [place] States was normal, female Rubella: Immune (04/19 0000)  GTT Early: Third trimester:  RPR: Nonreactive (04/19 0000)   Flu vaccine  HBsAg: Negative (04/19 0000)   TDaP vaccine  Rhogam: HIV: Non-reactive (04/19 0000)   Baby Food  Bottle  GBS: (For PCN allergy, check sensitivities) Neg  Contraception  Undecided Pap:  Circumcision  Yes, but it is a girl UDS + for MJ in April 2016  Pediatrician  Undecided, will decide by Monday   Support Person  No one, has a 30 yo son and he has a Dispensing optician       Past Medical History: Past Medical History  Diagnosis Date  . Medical history non-contributory     Past Surgical History: Past Surgical History  Procedure Laterality Date  . Ectopic pregnancy surgery      Obstetrical History: OB History    Gravida Para Term Preterm AB TAB SAB Ectopic Multiple Living   Social History: Social History   Social History  . Marital Status: Single    Spouse Name: N/A  . Number of Children: N/A  . Years of Education: N/A   Social History Main Topics  . Smoking status: Former Smoker -- 0.50 packs/day  . Smokeless tobacco: Not on file  . Alcohol Use: No  . Drug Use: No  . Sexual Activity: Yes   Other Topics Concern  . Not on file   Social History Narrative    Family History: No family history on file.  Allergies: No Known Allergies  Prescriptions prior to admission  Medication Sig Dispense Refill Last Dose  . Prenatal Vit-Fe Fumarate-FA (PRENATAL MULTIVITAMIN) TABS tablet Take 1 tablet by mouth daily at 12 noon.   Past Week at Unknown time     Review of Systems   All systems reviewed and negative except as stated in HPI  Last menstrual period 07/28/2014. General appearance: alert, cooperative, appears stated age and no distress Lungs: clear to auscultation bilaterally Heart: regular rate and rhythm Abdomen: soft, non-tender; bowel sounds normal Pelvic: perineal abrasion Extremities: Homans sign is negative, no sign of DVT DTR's wnl Presentation: cephalic Fetal monitoringnone, delivered at home Uterine activity: None     Prenatal labs: ABO, Rh: O/Positive/-- (04/19 0000) Antibody: Negative (04/19 0000) Rubella:  immune RPR: Nonreactive (04/19 0000)  HBsAg: Negative (04/19 0000)  HIV: Non-reactive (04/19 0000)  GBS:   Negative  1 hr Glucola wnl  Genetic screening  negative Anatomy US wnl   No results found for this or any previous visit (from the past 24 hour(s)).  Patient Active Problem List   Diagnosis Date Noted  . Marijuana use 04/08/2015  . Trichomonal vaginitis during pregnancy in first trimester 04/03/2015  . Limited prenatal care in third trimester 04/03/2015  . History of unilateral salpingectomy 04/03/2015    Assessment: Kelly Avila is a 34 y.o. G3P1011 at [redacted]w[redacted]d here for SVD delivery at home. Placenta  delivered at home spontaneously about 10-15 minutes later.  #Labor: Delivered at home. Admit to postpartum. #Pain: Ibuprofen, percocet. #ID:  GBS negative #MOF: Bottle #MOC:OCP #Labs to be ordered: CBC, HgSAg, UDS  Durenda Hurt, DO 04/08/2015, 12:32 AM   Seen and examined by me also Agree with note I saw this patient 4 days ago and did cultures in case she delivered before she could get a visit Has not had care in the past month Delivered at home without problem Placenta delivered spontaneously Patient is stable and her bleeding is small One small laceration on midline perineum, not bleeding, not repaired Aviva Signs, CNM

## 2015-04-09 ENCOUNTER — Encounter: Payer: Medicaid Other | Admitting: Family Medicine

## 2015-04-09 MED ORDER — IBUPROFEN 600 MG PO TABS: 600.0000 mg | ORAL_TABLET | Freq: Four times a day (QID) | ORAL | Status: DC

## 2015-04-09 NOTE — Clinical Social Work Maternal (Signed)
CLINICAL SOCIAL WORK MATERNAL/CHILD NOTE  Patient Details  Name: Kelly Avila MRN: [id number] Date of Birth: 06/09/1981  Date:  04/09/2015  Clinical Social Worker Initiating Note:  Kelly Avila Date/ Time Initiated:  04/09/15/0930     Child's Name:  Kelly Avila   Legal Guardian:  Mother   Need for Interpreter:  None   Date of Referral:  04/08/15     Reason for Referral:  Current Substance Use/Substance Use During Pregnancy -- THC   Referral Source:  Central Nursery   Address:  [address] [place], [place] [address]  Phone number:  [phone]   Household Members:  Minor Children: Kelly Avila (03/28/11)   Natural Supports (not living in the home):  Immediate Family, Friends   Professional Supports: None   Employment: Full-time   Type of Work: Works at [employer]   Education:    Avila/A  Financial Resources:  Medicaid   Other Resources:  Food Stamps , WIC   Cultural/Religious Considerations Which May Impact Care:  None reported  Strengths:  Ability to meet basic needs , Pediatrician chosen , Home prepared for child    Risk Factors/Current Problems:   1)Substance Use: MOB presents with THC use during the pregnancy. Infant's UDS is positive for THC and MDS is pending.   Cognitive State:  Able to Concentrate , Alert , Goal Oriented , Linear Thinking    Mood/Affect:  Calm , Comfortable , Happy    CSW Assessment:  CSW received request for consult due to MOB presenting with history of THC use during the pregnancy.  MOB's 4 year old son was also present during the assessment. MOB presented in a pleasant mood, displayed a full range in affect, but was a limited historian as she was not forthcoming with her thoughts, feelings, and substance use during the pregnancy.   MOB denied questions, concerns, or needs as she prepares to discharge home. She stated that she recently moved, about one month ago, to [place] from [place].  She reported that she has  previously lived in [place], and decided to return to [place] since she did not enjoy living in [place]. MOB stated that her cousin lives in [place], and she has friends/co-workers who are included in her support system. She shared that the FOB also lives in town, but reported that they are not in a significant relationship.  She discussed efforts to not allow their relationship to be a stressor since it is outside of her control. Per MOB, she has the home prepared for the infant and is looking forward to discharge. She denied any lingering stressors associated with the recent move.   MOB denied history of postpartum depression or significant mental health history.  Per MOB, she felt "depressed" during the pregnancy due to the relational stress with the FOB.  MOB clarified that she was crying more than normal, but denied any additional acute symptoms. She shared that she has felt "better" since she has moved to a state of acceptance about their relationship.  MOB presented as receptive to education on perinatal mood and anxiety disorders, and agreed to contact her OB if she notes signs and symptoms postpartum.   MOB originally reported THC use only "early" during the pregnancy.  CSW provided education on hospital drug screen policy, and informed MOB that infant's UDS is positive for THC.  MOB never clarified exact use and last use; however, reported that she was nauseous during the entire pregnancy.  MOB verbalized understanding that CPS   will be contacted due to the drug screen. She denied any prior CPS involvement, and asked questions related what to anticipate and expect with CPS involvement. MOB denied any additional questions or concerns, and is agreeable to follow up with CPS.  MOB expressed appreciation for the visit, and agreed to contact CSW if additional needs arise during the admission.   CSW Plan/Description:   1)Patient/Family Education: Perinatal mood and anxiety disorders, hospital drug  screen policy  2)Child Protective Service Report: Report made to [place] CPS due to infant's +UDS for THC. No additional psychosocial stressors, CPS to follow up within 72 hours of receipt of report.   3) CSW to monitor infant's MDS, and will notify CPS of results.  4)No Further Intervention Required/No Barriers to Discharge   Kelly Ivancic Avila, Avila 04/09/2015, 11:03 AM  

## 2015-04-09 NOTE — Discharge Summary (Signed)
OB Discharge Summary  Patient Name: Kelly Avila DOB: Oct 13, 1980 MRN: [id number]  Date of admission: 04/08/2015 Delivering MD:     Date of discharge: 04/09/2015  Admitting diagnosis: Delivered Intrauterine pregnancy: [redacted]w[redacted]d     Secondary diagnosis: None     Discharge diagnosis: Term Pregnancy Delivered                                                                                                Post partum procedures:none  Augmentation: none  Complications: None  Hospital course:  Onset of Labor With Vaginal Delivery     34 y.o. yo [id number] at [redacted]w[redacted]d was admitted in Active Laboron 04/08/2015. Patient had an uncomplicated labor course as follows:  Membrane Rupture Time/Date: 11:22 PM ,04/08/2015   Intrapartum Procedures: Episiotomy: None [1]                                         Lacerations:     Mediations and procedures used include: N/A  Patient had a delivery of a Viable infant. 04/07/2015  Information for the patient's newborn:  Diesha, Rostad [[id number]]  Delivery Method: Vag-Spont    [name] had an uncomplicated postpartum course.  She is ambulating, tolerating a regular diet, passing flatus, and urinating well. Patient is discharged home in stable condition on No discharge date for patient encounter.Marland Kitchen    Physical exam  Filed Vitals:   04/08/15 0250 04/08/15 0652 04/08/15 1450 04/08/15 1811  BP: 139/61 133/63 117/62 117/68  Pulse: 70 65 68 69  Temp: 98.6 F (37 C) 98.5 F (36.9 C) 98.4 F (36.9 C) 98 F (36.7 C)  TempSrc: Oral Oral Oral Oral  Resp: SpO2: 100% 99%     General: alert, cooperative and no distress Lochia: appropriate Uterine Fundus: firm Incision: N/A DVT Evaluation: No evidence of DVT seen on physical exam. Negative Homan's sign. No cords or calf tenderness. Labs: Lab Results  Component Value Date   WBC 15.4* 04/08/2015   HGB 10.4* 04/08/2015   HCT 31.7* 04/08/2015   MCV 93.2 04/08/2015   PLT 201 04/08/2015    CMP Latest Ref Rng 08/30/2014  Glucose 70 - 99 mg/dL 83  BUN 6 - 23 mg/dL 6  Creatinine 1.30 - 8.65 mg/dL 7.84  Sodium 696 - 295 mmol/L 133(L)  Potassium 3.5 - 5.1 mmol/L 3.5  Chloride 96 - 112 mmol/L 104  CO2 19 - 32 mmol/L 23  Calcium 8.4 - 10.5 mg/dL 9.1  Total Protein 6.0 - 8.3 g/dL 6.9  Total Bilirubin 0.3 - 1.2 mg/dL 0.6  Alkaline Phos 39 - 117 U/L 49  AST 0 - 37 U/L 15  ALT 0 - 35 U/L 13    Discharge instruction: per After Visit Summary and "Baby and Me Booklet".  Medications:  Current facility-administered medications:  .  acetaminophen (TYLENOL) tablet 650 mg, 650 mg, Oral, Q4H PRN, Aviva Signs, CNM .  benzocaine-Menthol (DERMOPLAST) 20-0.5 % topical spray 1 application,  1 application, Topical, PRN, Aviva Signs, CNM .  witch hazel-glycerin (TUCKS) pad 1 application, 1 application, Topical, PRN **AND** dibucaine (NUPERCAINAL) 1 % rectal ointment 1 application, 1 application, Rectal, PRN, Aviva Signs, CNM .  diphenhydrAMINE (BENADRYL) capsule 25 mg, 25 mg, Oral, Q6H PRN, Aviva Signs, CNM .  ibuprofen (ADVIL,MOTRIN) tablet 600 mg, 600 mg, Oral, 4 times per day, Aviva Signs, CNM, 600 mg at 04/09/15 0022 .  lanolin ointment, , Topical, PRN, Aviva Signs, CNM .  ondansetron (ZOFRAN) tablet 4 mg, 4 mg, Oral, Q4H PRN **OR** ondansetron (ZOFRAN) injection 4 mg, 4 mg, Intravenous, Q4H PRN, Aviva Signs, CNM .  oxyCODONE-acetaminophen (PERCOCET/ROXICET) 5-325 MG per tablet 1 tablet, 1 tablet, Oral, Q4H PRN, Aviva Signs, CNM .  oxyCODONE-acetaminophen (PERCOCET/ROXICET) 5-325 MG per tablet 2 tablet, 2 tablet, Oral, Q4H PRN, Aviva Signs, CNM .  prenatal multivitamin tablet 1 tablet, 1 tablet, Oral, Q1200, Aviva Signs, CNM, 1 tablet at 04/08/15 1146 .  senna-docusate (Senokot-S) tablet 2 tablet, 2 tablet, Oral, Q24H, Aviva Signs, CNM, 2 tablet at 04/09/15 0022 .  simethicone (MYLICON) chewable tablet 80 mg, 80 mg, Oral, PRN, Aviva Signs, CNM .  Tdap (BOOSTRIX) injection 0.5 mL, 0.5 mL, Intramuscular, Once, Aviva Signs, CNM .  zolpidem (AMBIEN) tablet 5 mg, 5 mg, Oral, QHS PRN, Aviva Signs, CNM  Diet: routine diet  Activity: Advance as tolerated. Pelvic rest for 6 weeks.   Outpatient follow up:6 weeks  Postpartum contraception: Estrogen Pills  Newborn Data: Live born female  Birth Weight: 6 lb 10.9 oz (3030 g) APGAR: , 9  Baby Feeding: Bottle Disposition:home with mother   04/09/2015 Ferdie Ping, CNM

## 2015-04-10 LAB — RPR: RPR: NONREACTIVE

## 2017-06-01 ENCOUNTER — Encounter (HOSPITAL_COMMUNITY): Payer: Self-pay | Admitting: *Deleted

## 2017-06-01 DIAGNOSIS — Z87891 Personal history of nicotine dependence: Secondary | ICD-10-CM | POA: Insufficient documentation

## 2017-06-01 DIAGNOSIS — K0889 Other specified disorders of teeth and supporting structures: Secondary | ICD-10-CM | POA: Insufficient documentation

## 2017-06-01 NOTE — ED Triage Notes (Signed)
Pt reports left sided facial swelling that started this morning, has been having left sided dental pain for several days. Denies fevers. otc meds for pain not helping

## 2017-06-02 ENCOUNTER — Emergency Department (HOSPITAL_COMMUNITY)
Admission: EM | Admit: 2017-06-02 | Discharge: 2017-06-02 | Disposition: A | Discharge status: Home / Self Care | Payer: Self-pay | Attending: Emergency Medicine | Admitting: Emergency Medicine

## 2017-06-02 DIAGNOSIS — K029 Dental caries, unspecified: Secondary | ICD-10-CM

## 2017-06-02 MED ORDER — NAPROXEN 500 MG PO TABS
500.0000 mg | ORAL_TABLET | Freq: Once | ORAL | Status: AC
  Administered 2017-06-02: 500 mg via ORAL
  Filled 2017-06-02: qty 1

## 2017-06-02 MED ORDER — PENICILLIN V POTASSIUM 500 MG PO TABS: 500.0000 mg | ORAL_TABLET | Freq: Four times a day (QID) | ORAL | 0 refills | Status: DC

## 2017-06-02 MED ORDER — PENICILLIN V POTASSIUM 500 MG PO TABS
500.0000 mg | ORAL_TABLET | Freq: Once | ORAL | Status: AC
Start: 2017-06-02 — End: 2017-06-02
  Administered 2017-06-02: 500 mg via ORAL
  Filled 2017-06-02: qty 1

## 2017-06-02 MED ORDER — NAPROXEN 375 MG PO TABS: 375.0000 mg | ORAL_TABLET | Freq: Two times a day (BID) | ORAL | 0 refills | Status: DC

## 2017-06-02 NOTE — ED Notes (Addendum)
Pt upset and using profanity that she was "wasted her day here if we are not giving her pain medication" Pt given penicillin and naproxen. Pt took the medication and said she had to leave her ride was here. Refused another set of vitals and signing, left with discharge paperwork.

## 2017-06-02 NOTE — ED Provider Notes (Signed)
[facility]-EMERGENCY DEPT Provider Note   CSN: [id number] Arrival date & time: 06/01/17  2218     History   Chief Complaint Chief Complaint  Patient presents with  . Facial Swelling    HPI Kelly Avila is a 36 y.o. female.  The history is provided by the patient.  Dental Pain   This is a new problem. The current episode started more than 2 days ago. The problem occurs constantly. The problem has been gradually worsening. The pain is severe. Treatments tried: ibuprofen. The treatment provided no relief.  Multiple necrotic and broken off teeth worsening pain and states swelling of nasal labial fold on the left.  No f/c/r.    Past Medical History:  Diagnosis Date  . Medical history non-contributory     Patient Active Problem List   Diagnosis Date Noted  . Marijuana use 04/08/2015  . Vaginal delivery 04/08/2015  . Trichomonal vaginitis during pregnancy in first trimester 04/03/2015  . Limited prenatal care in third trimester 04/03/2015  . History of unilateral salpingectomy 04/03/2015    Past Surgical History:  Procedure Laterality Date  . ECTOPIC PREGNANCY SURGERY      OB History    Gravida Para Term Preterm AB Living   3 2 2   1 2    SAB TAB Ectopic Multiple Live Births       1 0 2       Home Medications    Prior to Admission medications   Medication Sig Start Date End Date Taking? Authorizing Provider  ibuprofen (ADVIL,MOTRIN) 600 MG tablet Take 1 tablet (600 mg total) by mouth every 6 (six) hours. 04/09/15   Montez MoritaLawson, Marie D, CNM  Prenatal Vit-Fe Fumarate-FA (PRENATAL MULTIVITAMIN) TABS tablet Take 1 tablet by mouth daily at 12 noon.    [provider]    Family History No family history on file.  Social History Social History   Tobacco Use  . Smoking status: Former Smoker    Packs/day: 0.50  Substance Use Topics  . Alcohol use: No  . Drug use: No     Allergies   Patient has no known allergies.   Review of  Systems Review of Systems  HENT: Positive for dental problem. Negative for drooling, sinus pressure, sinus pain, sore throat, trouble swallowing and voice change.   All other systems reviewed and are negative.    Physical Exam Updated Vital Signs BP 121/89 (BP Location: Right Arm)   Pulse (!) 108   Temp 98.1 F (36.7 C) (Oral)   Resp 16   LMP 05/31/2017   SpO2 100%   Physical Exam  Constitutional: She is oriented to person, place, and time. She appears well-developed and well-nourished.  HENT:  Head: Normocephalic and atraumatic.    Mouth/Throat: Oropharynx is clear and moist. No trismus in the jaw. Dental caries present. No uvula swelling. No oropharyngeal exudate.    No swelling of the lips tongue or uvula, no LAN no cheek or neck swelling  Eyes: Conjunctivae are normal. Pupils are equal, round, and reactive to light.  Neck: Normal range of motion. Neck supple. No tracheal deviation present.  Cardiovascular: Normal rate, regular rhythm, normal heart sounds and intact distal pulses.  Pulmonary/Chest: Effort normal and breath sounds normal. No stridor. No respiratory distress. She has no wheezes. She has no rales.  Abdominal: Soft. Bowel sounds are normal. She exhibits no mass. There is no tenderness. There is no rebound and no guarding.  Musculoskeletal: Normal range of motion.  Neurological: She is alert and oriented to person, place, and time. She displays normal reflexes.  Skin: Skin is warm and dry. Capillary refill takes less than 2 seconds.  Psychiatric: She has a normal mood and affect.  Nursing note and vitals reviewed.    ED Treatments / Results   Vitals:   06/01/17 2228  BP: 121/89  Pulse: (!) 108  Resp: 16  Temp: 98.1 F (36.7 C)  SpO2: 100%     Radiology No results found.  Procedures Procedures (including critical care time)  Medications Ordered in ED Penicillin Naproxen     Final Clinical Impressions(s) / ED Diagnoses   All questions  answered to the patient's satisfaction.    Strict return precautions for fever, global weakness, blood in the urine, abdominal distention, vomiting, no drainage from the foley catheter, swelling or the lips or tongue, chest pain, dyspnea on exertion, new weakness or numbness changes in vision or speech, fevers, weakness persistent pain, Inability to tolerate liquids or food, changes in voice cough, altered mental status or any concerns. No signs of systemic illness or infection. The patient is nontoxic-appearing on exam and vital signs are within normal limits.    I have reviewed the triage vital signs and the nursing notes. Pertinent labs &imaging results that were available during my care of the patient were reviewed by me and considered in my medical decision making (see chart for details).  After history, exam, and medical workup I feel the patient has been appropriately medically screened and is safe for discharge home. Pertinent diagnoses were discussed with the patient. Patient was given return precautions   Loki Wuthrich, MD 06/02/17 0104

## 2017-06-02 NOTE — ED Provider Notes (Signed)
Nurse reported patient began cursing when she did not get narcotic pain medication.     Dilara Navarrete, MD 06/02/17 [id number]

## 2017-12-06 ENCOUNTER — Emergency Department (HOSPITAL_COMMUNITY)
Admission: EM | Admit: 2017-12-06 | Discharge: 2017-12-06 | Disposition: A | Discharge status: Home / Self Care | Payer: Self-pay | Attending: Emergency Medicine | Admitting: Emergency Medicine

## 2017-12-06 ENCOUNTER — Encounter (HOSPITAL_COMMUNITY): Payer: Self-pay | Admitting: Emergency Medicine

## 2017-12-06 ENCOUNTER — Ambulatory Visit (HOSPITAL_COMMUNITY)
Admission: EM | Admit: 2017-12-06 | Discharge: 2017-12-06 | Disposition: A | Discharge status: Home / Self Care | Payer: Self-pay | Attending: Physician Assistant | Admitting: Physician Assistant

## 2017-12-06 DIAGNOSIS — Z5321 Procedure and treatment not carried out due to patient leaving prior to being seen by health care provider: Secondary | ICD-10-CM | POA: Insufficient documentation

## 2017-12-06 DIAGNOSIS — K05219 Aggressive periodontitis, localized, unspecified severity: Secondary | ICD-10-CM

## 2017-12-06 DIAGNOSIS — K0889 Other specified disorders of teeth and supporting structures: Secondary | ICD-10-CM | POA: Insufficient documentation

## 2017-12-06 MED ORDER — AMOXICILLIN-POT CLAVULANATE 875-125 MG PO TABS: 1.0000 | ORAL_TABLET | Freq: Two times a day (BID) | ORAL | 0 refills | Status: DC

## 2017-12-06 NOTE — ED Notes (Signed)
PT CALLED FOR ROOM NO ANSWER X 1 

## 2017-12-06 NOTE — ED Provider Notes (Signed)
12/06/2017 12:22 PM   DOB: [birth year] / MRN: [id number]  SUBJECTIVE:  Kelly Avila is a 37 y.o. female presenting for left maxillary sinus tenderness x36 hours which is worsening.  She tells me "I have a bad tooth and cannot chew on that side."  Just had her insurance start with her new job and plans to see a dentist.  She is not sure if she is pregnant or not.  She denies fever, chills, nausea, headache.  She has No Known Allergies.   She  has a past medical history of Medical history non-contributory.    She  reports that she has been smoking cigarettes.  She has been smoking about 0.50 packs per day. She has never used smokeless tobacco. She reports that she does not drink alcohol or use drugs. She  reports that she currently engages in sexual activity. The patient  has a past surgical history that includes Ectopic pregnancy surgery.  Her family history is not on file.  ROS  OBJECTIVE:  BP 125/83   Pulse 92   Temp 97.7 F (36.5 C)   Resp 18   LMP 11/11/2017   SpO2 99%   Wt Readings from Last 3 Encounters:  04/07/15 188 lb 6 oz (85.4 kg)  04/03/15 188 lb 6.4 oz (85.5 kg)  02/07/13 150 lb (68 kg)   Temp Readings from Last 3 Encounters:  12/06/17 97.7 F (36.5 C)  12/06/17 98.1 F (36.7 C) (Oral)  06/01/17 98.1 F (36.7 C) (Oral)   BP Readings from Last 3 Encounters:  12/06/17 125/83  12/06/17 (!) 145/94  06/01/17 121/89   Pulse Readings from Last 3 Encounters:  12/06/17 92  12/06/17 77  06/01/17 (!) 108    Physical Exam  Constitutional: She is oriented to person, place, and time. She appears well-nourished. No distress.  HENT:  Head:    Mouth/Throat:    Eyes: Pupils are equal, round, and reactive to light. EOM are normal.  Cardiovascular: Normal rate.  Pulmonary/Chest: Effort normal.  Abdominal: She exhibits no distension.  Neurological: She is alert and oriented to person, place, and time. No cranial nerve deficit. Gait normal.  Skin: Skin is dry. She  is not diaphoretic.  Psychiatric: She has a normal mood and affect.  Vitals reviewed.   No results found for this or any previous visit (from the past 72 hour(s)).  No results found.  ASSESSMENT AND PLAN:   Pain, dental: Starting antibiotics.  RTC precautions discussed.  Patient to follow pain plan per AVS.  Gingival abscess    Discharge Instructions     Augmentin +1000 mg of Tylenol every 8 hours.  If you are not pregnant within 600 mg of ibuprofen every 6-8 hours would provide better pain control than Tylenol.  Come back if you are worsening.        The patient is advised to call or return to clinic if she does not see an improvement in symptoms, or to seek the care of the closest emergency department if she worsens with the above plan.   Deliah Boston, MHS, PA-C 12/06/2017 12:22 PM   Kelly Neas, PA-C 12/06/17 1223

## 2017-12-06 NOTE — Discharge Instructions (Signed)
Augmentin +1000 mg of Tylenol every 8 hours.  If you are not pregnant within 600 mg of ibuprofen every 6-8 hours would provide better pain control than Tylenol.  Come back if you are worsening.

## 2017-12-06 NOTE — ED Triage Notes (Signed)
Pt c/o L upper tooth pain, states its swollen and requesting an antibiotic.

## 2017-12-06 NOTE — ED Triage Notes (Signed)
Pt c/o left upper posterior dental pain and swelling

## 2019-02-21 ENCOUNTER — Emergency Department (HOSPITAL_COMMUNITY)
Admission: EM | Admit: 2019-02-21 | Discharge: 2019-02-21 | Disposition: A | Discharge status: Home / Self Care | Payer: Self-pay | Attending: Emergency Medicine | Admitting: Emergency Medicine

## 2019-02-21 ENCOUNTER — Encounter (HOSPITAL_COMMUNITY): Payer: Self-pay | Admitting: Emergency Medicine

## 2019-02-21 ENCOUNTER — Other Ambulatory Visit: Payer: Self-pay

## 2019-02-21 DIAGNOSIS — F17210 Nicotine dependence, cigarettes, uncomplicated: Secondary | ICD-10-CM | POA: Insufficient documentation

## 2019-02-21 DIAGNOSIS — B309 Viral conjunctivitis, unspecified: Secondary | ICD-10-CM | POA: Insufficient documentation

## 2019-02-21 MED ORDER — ERYTHROMYCIN 5 MG/GM OP OINT: TOPICAL_OINTMENT | OPHTHALMIC | 0 refills | Status: DC

## 2019-02-21 NOTE — ED Provider Notes (Signed)
[facility]-EMERGENCY DEPT Provider Note   CSN: [id number] Arrival date & time: 02/21/19  1550    History   Chief Complaint Chief Complaint  Patient presents with  . Conjunctivitis    HPI Kelly Avila is a 38 y.o. female presents today for concern of conjunctivitis.  Patient reports that yesterday she began having bilateral conjunctival erythema and that when she woke up this morning she noticed some drainage that stuck her eyelids together, she reports this has not reoccurred and she has had no drainage since.  She describes her eyes as looking somewhat more red than normal she denies any injury or trauma, chemical exposures, body fluid exposures or sick contacts.  She reports that she is otherwise feeling well.  She denies any pain, irritation, vision changes, photophobia or swelling. Patient denies contact lens use.     HPI  Past Medical History:  Diagnosis Date  . Medical history non-contributory     Patient Active Problem List   Diagnosis Date Noted  . Marijuana use 04/08/2015  . Vaginal delivery 04/08/2015  . Trichomonal vaginitis during pregnancy in first trimester 04/03/2015  . Limited prenatal care in third trimester 04/03/2015  . History of unilateral salpingectomy 04/03/2015    Past Surgical History:  Procedure Laterality Date  . ECTOPIC PREGNANCY SURGERY       OB History    Gravida  3   Para  2   Term  2   Preterm      AB  1   Living  2     SAB      TAB      Ectopic  1   Multiple  0   Live Births  2            Home Medications    Prior to Admission medications   Medication Sig Start Date End Date Taking? Authorizing Provider  amoxicillin-clavulanate (AUGMENTIN) 875-125 MG tablet Take 1 tablet by mouth every 12 (twelve) hours. 12/06/17   Ofilia Neaslark, Michael L, PA-C  erythromycin ophthalmic ointment Place a 1/2 inch ribbon of ointment into the lower eyelid 4 times daily for the next 5 days. 02/21/19   Bill SalinasMorelli, Brandon  A, PA-C  Prenatal Vit-Fe Fumarate-FA (PRENATAL MULTIVITAMIN) TABS tablet Take 1 tablet by mouth daily at 12 noon.    [provider]    Family History No family history on file.  Social History Social History   Tobacco Use  . Smoking status: Current Every Day Smoker    Packs/day: 0.50    Types: Cigarettes  . Smokeless tobacco: Never Used  Substance Use Topics  . Alcohol use: No  . Drug use: No     Allergies   Patient has no known allergies.   Review of Systems Review of Systems  Constitutional: Negative.  Negative for chills and fever.  HENT: Positive for rhinorrhea. Negative for congestion, ear pain, facial swelling, sore throat, trouble swallowing and voice change.   Eyes: Positive for redness. Negative for photophobia, pain, discharge, itching and visual disturbance.  Respiratory: Negative.  Negative for cough.   Gastrointestinal: Negative.  Negative for nausea and vomiting.  Musculoskeletal: Negative.  Negative for neck pain.  Neurological: Negative.  Negative for headaches.   Physical Exam Updated Vital Signs BP (!) 124/91 (BP Location: Left Arm)   Pulse 82   Temp 98.5 F (36.9 C) (Oral)   Resp 16   Ht 5\' 6"  (1.676 m)   Wt 81.6 kg   LMP 01/21/2019  SpO2 99%   BMI 29.05 kg/m   Physical Exam Constitutional:      General: She is not in acute distress.    Appearance: Normal appearance. She is well-developed. She is not ill-appearing or diaphoretic.  HENT:     Head: Normocephalic and atraumatic.     Jaw: There is normal jaw occlusion. No trismus.     Right Ear: External ear normal.     Left Ear: External ear normal.     Nose: Rhinorrhea present. Rhinorrhea is clear.     Right Sinus: No maxillary sinus tenderness or frontal sinus tenderness.     Left Sinus: No maxillary sinus tenderness or frontal sinus tenderness.     Mouth/Throat:     Mouth: Mucous membranes are moist.     Pharynx: Oropharynx is clear. Uvula midline.  Eyes:     General:  Vision grossly intact. Gaze aligned appropriately.     Extraocular Movements: Extraocular movements intact.     Pupils: Pupils are equal, round, and reactive to light.     Slit lamp exam:    Right eye: Anterior chamber quiet.     Left eye: Anterior chamber quiet.     Comments: Bilateral eyes: With minimal medial conjunctival erythema.  No scleral icterus.  No discharge.  Pupils equal round reactive light accommodating.  Extraocular movements intact without nystagmus or pain.  No photophobia or consensual photophobia.  No visible foreign bodies.  Neck:     Musculoskeletal: Full passive range of motion without pain, normal range of motion and neck supple.     Trachea: Trachea and phonation normal. No tracheal tenderness or tracheal deviation.  Pulmonary:     Effort: Pulmonary effort is normal. No respiratory distress.  Abdominal:     General: There is no distension.     Palpations: Abdomen is soft.     Tenderness: There is no abdominal tenderness. There is no guarding or rebound.  Musculoskeletal: Normal range of motion.  Skin:    General: Skin is warm and dry.  Neurological:     Mental Status: She is alert.     GCS: GCS eye subscore is 4. GCS verbal subscore is 5. GCS motor subscore is 6.     Comments: Speech is clear and goal oriented, follows commands Major Cranial nerves without deficit, no facial droop Moves extremities without ataxia, coordination intact  Psychiatric:        Behavior: Behavior normal.     ED Treatments / Results  Labs (all labs ordered are listed, but only abnormal results are displayed) Labs Reviewed - No data to display  EKG None  Radiology No results found.  Procedures Procedures (including critical care time)  Medications Ordered in ED Medications - No data to display   Initial Impression / Assessment and Plan / ED Course  I have reviewed the triage vital signs and the nursing notes.  Pertinent labs & imaging results that were available  during my care of the patient were reviewed by me and considered in my medical decision making (see chart for details).    38 year old female with presentation consistent with viral conjunctivitis.  Very mild bilateral conjunctival erythema.  No purulent discharge, corneal abrasions, entrapment, photophobia, consensual photophobia, pain, injury, visual changes, chemical or biologic exposures, or vesicles.  Presentation non-concerning for iritis, bacterial conjunctivitis, corneal abrasions, HSV or other emergent ophthalmological conditions.  Will prescribe erythromycin ointment due to concern for development of bacterial conjunctivitis, she denies contact lens use.  Personal  hygiene and frequent handwashing discussed.  Patient advised to followup with primary care provider tomorrow and return to the ED for new or worsening symptoms including vision change or purulent discharge.  Patient verbalizes understanding and is agreeable with discharge.  Patient was offered fluorescein study but refused and would prefer to leave as soon as possible to return home with work-note.  Based on patient's symptoms, lack of injury/pain and exam feel this is reasonable.  At this time there does not appear to be any evidence of an acute emergency medical condition and the patient appears stable for discharge with appropriate outpatient follow up. Diagnosis was discussed with patient who verbalizes understanding of care plan and is agreeable to discharge. I have discussed return precautions with patient who verbalizes understanding of return precautions. Patient encouraged to follow-up with their PCP. All questions answered.  Patient has been discharged in good condition.  Note: Portions of this report may have been transcribed using voice recognition software. Every effort was made to ensure accuracy; however, inadvertent computerized transcription errors may still be present. Final Clinical Impressions(s) / ED Diagnoses    Final diagnoses:  Viral conjunctivitis of both eyes    ED Discharge Orders         Ordered    erythromycin ophthalmic ointment     02/21/19 1825           Deliah Boston, PA-C 02/21/19 1827    Lucrezia Starch, MD 02/23/19 1416

## 2019-02-21 NOTE — Discharge Instructions (Signed)
You have been diagnosed today with Viral Conjunctivitis.  At this time there does not appear to be the presence of an emergent medical condition, however there is always the potential for conditions to change. Please read and follow the below instructions.  Please return to the Emergency Department immediately for any new or worsening symptoms or if your symptoms do not improve within 2 days. Please be sure to follow up with your Primary Care Provider within one week regarding your visit today; please call their office to schedule an appointment even if you are feeling better for a follow-up visit. Please use the antibiotic ointment erythromycin as prescribed in each lower eyelid 4 times daily for the next 5 days.  Get help right away if: Your symptoms do not improve with treatment or they get worse. You have pain. Your vision becomes blurry. You have a fever. You have facial pain, redness, or swelling. You have yellow or green drainage coming from your eye. You have new/concerning or worsening symptoms  Please read the additional information packets attached to your discharge summary.  Do not take your medicine if  develop an itchy rash, swelling in your mouth or lips, or difficulty breathing; call 911 and seek immediate emergency medical attention if this occurs.

## 2019-02-21 NOTE — ED Triage Notes (Signed)
Pt concern for pink eyes to both eyes. Redness and heaviness; denies drainage.

## 2019-02-28 ENCOUNTER — Ambulatory Visit (HOSPITAL_COMMUNITY)
Admission: EM | Admit: 2019-02-28 | Discharge: 2019-02-28 | Disposition: A | Discharge status: Home / Self Care | Payer: Self-pay | Attending: Emergency Medicine | Admitting: Emergency Medicine

## 2019-02-28 ENCOUNTER — Encounter (HOSPITAL_COMMUNITY): Payer: Self-pay | Admitting: Emergency Medicine

## 2019-02-28 DIAGNOSIS — Z3202 Encounter for pregnancy test, result negative: Secondary | ICD-10-CM

## 2019-02-28 DIAGNOSIS — N898 Other specified noninflammatory disorders of vagina: Secondary | ICD-10-CM

## 2019-02-28 LAB — POCT PREGNANCY, URINE: Preg Test, Ur: NEGATIVE

## 2019-02-28 MED ORDER — METRONIDAZOLE 500 MG PO TABS: 500.0000 mg | ORAL_TABLET | Freq: Two times a day (BID) | ORAL | 0 refills | Status: AC

## 2019-02-28 MED ORDER — ONDANSETRON 4 MG PO TBDP: 4.0000 mg | ORAL_TABLET | Freq: Three times a day (TID) | ORAL | 0 refills | Status: DC | PRN

## 2019-02-28 NOTE — Discharge Instructions (Signed)
Please begin taking metronidazole twice daily for the next week to treat for bacterial vaginosis.  This would also treat trichomonas.  Please not drink alcohol until 24 hours after last tablet.  May use Zofran 30 minutes prior to taking if causing a lot of nausea.  We are testing you for Gonorrhea, Chlamydia, Trichomonas, Yeast and Bacterial Vaginosis. We will call you if anything is positive and let you know if you require any further treatment. Please inform partners of any positive results.   Please return if symptoms not improving with treatment, development of fever, nausea, vomiting, abdominal pain.

## 2019-02-28 NOTE — ED Provider Notes (Signed)
[place]    CSN: [id number] Arrival date & time: 02/28/19  1036      History   Chief Complaint Chief Complaint  Patient presents with  . Vaginal Discharge    HPI Kelly Avila is a 38 y.o. female no contributing past medical history presenting today for evaluation of vaginal discharge.  Patient has had vaginal discharge for the past month.  Occasionally has noticed a fishy odor.  Denies any associated itching irritation or burning.  Denies dysuria.  Has had some slight increase in urinary frequency.  Denies hematuria.  Denies pelvic pain.  Has had some mild nausea.  Denies vomiting.  Denies fevers.  Patient has had a new partner and wishes to be screened for STDs.  Denies known exposure.  Denies rash or lesion.  Denies history of BV.  Does note that she often uses caress or various scented body washes.  Last menstrual cycle was at the beginning of August.  Denies use of birth control.  HPI  Past Medical History:  Diagnosis Date  . Medical history non-contributory     Patient Active Problem List   Diagnosis Date Noted  . Marijuana use 04/08/2015  . Vaginal delivery 04/08/2015  . Trichomonal vaginitis during pregnancy in first trimester 04/03/2015  . Limited prenatal care in third trimester 04/03/2015  . History of unilateral salpingectomy 04/03/2015    Past Surgical History:  Procedure Laterality Date  . ECTOPIC PREGNANCY SURGERY      OB History    Gravida  3   Para  2   Term  2   Preterm      AB  1   Living  2     SAB      TAB      Ectopic  1   Multiple  0   Live Births  2            Home Medications    Prior to Admission medications   Medication Sig Start Date End Date Taking? Authorizing Provider  metroNIDAZOLE (FLAGYL) 500 MG tablet Take 1 tablet (500 mg total) by mouth 2 (two) times daily for 7 days. 02/28/19 03/07/19  Kaiah Hosea C, PA-C  ondansetron (ZOFRAN ODT) 4 MG disintegrating tablet Take 1 tablet (4 mg total) by  mouth every 8 (eight) hours as needed for nausea or vomiting. 02/28/19   Banesa Tristan C, PA-C  Prenatal Vit-Fe Fumarate-FA (PRENATAL MULTIVITAMIN) TABS tablet Take 1 tablet by mouth daily at 12 noon.    [provider]    Family History No family history on file.  Social History Social History   Tobacco Use  . Smoking status: Current Every Day Smoker    Packs/day: 0.50    Types: Cigarettes  . Smokeless tobacco: Never Used  Substance Use Topics  . Alcohol use: No  . Drug use: No     Allergies   Patient has no known allergies.   Review of Systems Review of Systems  Constitutional: Negative for fever.  Respiratory: Negative for shortness of breath.   Cardiovascular: Negative for chest pain.  Gastrointestinal: Negative for abdominal pain, diarrhea, nausea and vomiting.  Genitourinary: Positive for vaginal discharge. Negative for dysuria, flank pain, genital sores, hematuria, menstrual problem, vaginal bleeding and vaginal pain.  Musculoskeletal: Negative for back pain.  Skin: Negative for rash.  Neurological: Negative for dizziness, light-headedness and headaches.     Physical Exam Triage Vital Signs ED Triage Vitals [02/28/19 1210]  Enc Vitals Group  BP (!) 130/97     Pulse Rate 82     Resp 18     Temp 98.5 F (36.9 C)     Temp src      SpO2 98 %     Weight      Height      Head Circumference      Peak Flow      Pain Score 0     Pain Loc      Pain Edu?      Excl. in GC?    No data found.  Updated Vital Signs BP (!) 130/97   Pulse 82   Temp 98.5 F (36.9 C)   Resp 18   LMP 02/12/2019   SpO2 98%   Visual Acuity Right Eye Distance:   Left Eye Distance:   Bilateral Distance:    Right Eye Near:   Left Eye Near:    Bilateral Near:     Physical Exam Vitals signs and nursing note reviewed.  Constitutional:      General: She is not in acute distress.    Appearance: She is well-developed.  HENT:     Head: Normocephalic and  atraumatic.  Eyes:     Conjunctiva/sclera: Conjunctivae normal.  Neck:     Musculoskeletal: Neck supple.  Cardiovascular:     Rate and Rhythm: Normal rate and regular rhythm.     Heart sounds: No murmur.  Pulmonary:     Effort: Pulmonary effort is normal. No respiratory distress.     Breath sounds: Normal breath sounds.  Abdominal:     Palpations: Abdomen is soft.     Tenderness: There is no abdominal tenderness.     Comments: Soft, nondistended, nontender to light and deep palpation throughout entire abdomen  Genitourinary:    Comments: Deferred Skin:    General: Skin is warm and dry.  Neurological:     Mental Status: She is alert.      UC Treatments / Results  Labs (all labs ordered are listed, but only abnormal results are displayed) Labs Reviewed  POC URINE PREG, ED  POCT PREGNANCY, URINE  CERVICOVAGINAL ANCILLARY ONLY    EKG   Radiology No results found.  Procedures Procedures (including critical care time)  Medications Ordered in UC Medications - No data to display  Initial Impression / Assessment and Plan / UC Course  I have reviewed the triage vital signs and the nursing notes.  Pertinent labs & imaging results that were available during my care of the patient were reviewed by me and considered in my medical decision making (see chart for details).     Pregnancy test negative.  Self swab obtained.  Will send to check for causes of discharge.  Will empirically treat for BV based off associated odor and use of scented soaps.  Will defer empiric STD treatment.  Continue to monitor,Discussed strict return precautions. Patient verbalized understanding and is agreeable with plan.  Final Clinical Impressions(s) / UC Diagnoses   Final diagnoses:  Vaginal discharge     Discharge Instructions     Please begin taking metronidazole twice daily for the next week to treat for bacterial vaginosis.  This would also treat trichomonas.  Please not drink alcohol  until 24 hours after last tablet.  May use Zofran 30 minutes prior to taking if causing a lot of nausea.  We are testing you for Gonorrhea, Chlamydia, Trichomonas, Yeast and Bacterial Vaginosis. We will call you if anything is positive and let  you know if you require any further treatment. Please inform partners of any positive results.   Please return if symptoms not improving with treatment, development of fever, nausea, vomiting, abdominal pain.    ED Prescriptions    Medication Sig Dispense Auth. Provider   metroNIDAZOLE (FLAGYL) 500 MG tablet Take 1 tablet (500 mg total) by mouth 2 (two) times daily for 7 days. 14 tablet Ruchy Wildrick C, PA-C   ondansetron (ZOFRAN ODT) 4 MG disintegrating tablet Take 1 tablet (4 mg total) by mouth every 8 (eight) hours as needed for nausea or vomiting. 20 tablet Nariya Neumeyer, [place] C, PA-C     Controlled Substance Prescriptions [place] Controlled Substance Registry consulted? Not Applicable   Lew DawesWieters, Josiah Nieto C, [place] 02/28/19 1233

## 2019-02-28 NOTE — ED Triage Notes (Signed)
Pt c/o vaginal discharge for the last few days, no urinary complaints, no pain.

## 2019-03-02 ENCOUNTER — Telehealth (HOSPITAL_COMMUNITY): Payer: Self-pay | Admitting: Emergency Medicine

## 2019-03-02 LAB — CERVICOVAGINAL ANCILLARY ONLY
Bacterial vaginitis: POSITIVE — AB
Candida vaginitis: NEGATIVE
Chlamydia: POSITIVE — AB
Neisseria Gonorrhea: NEGATIVE
Trichomonas: POSITIVE — AB

## 2019-03-02 MED ORDER — AZITHROMYCIN 250 MG PO TABS: 1000.0000 mg | ORAL_TABLET | Freq: Once | ORAL | 0 refills | Status: AC

## 2019-03-02 NOTE — Telephone Encounter (Signed)
Bacterial Vaginosis test is positive.  Prescription for metronidazole was given at the urgent care visit. Pt contacted regarding results. Answered all questions. Verbalized understanding.  Trichomonas is positive. Rx metronidazole was given at the urgent care visit. Pt needs education to please refrain from sexual intercourse for 7 days to give the medicine time to work. Sexual partners need to be notified and tested/treated. Condoms may reduce risk of reinfection. Recheck for further evaluation if symptoms are not improving.   Chlamydia is positive.  Rx po zithromax 1g #1 dose no refills was sent to the pharmacy of record.  Pt needs education to please refrain from sexual intercourse for 7 days to give the medicine time to work, sexual partners need to be notified and tested/treated.  Condoms may reduce risk of reinfection.  Recheck or followup with PCP for further evaluation if symptoms are not improving.   [facility] notified.  Attempted to reach patient. No answer at this time. Voicemail left.

## 2019-03-03 ENCOUNTER — Telehealth (HOSPITAL_COMMUNITY): Payer: Self-pay | Admitting: Emergency Medicine

## 2019-03-03 NOTE — Telephone Encounter (Signed)
Patient contacted and made aware of swab results, all questions answered.   

## 2020-06-29 ENCOUNTER — Other Ambulatory Visit: Payer: Self-pay

## 2020-06-29 ENCOUNTER — Other Ambulatory Visit (HOSPITAL_COMMUNITY)
Admission: RE | Admit: 2020-06-29 | Discharge: 2020-06-29 | Disposition: A | Discharge status: Home / Self Care | Payer: BC Managed Care – PPO | Source: Ambulatory Visit

## 2020-06-29 ENCOUNTER — Ambulatory Visit (INDEPENDENT_AMBULATORY_CARE_PROVIDER_SITE_OTHER): Payer: BC Managed Care – PPO

## 2020-06-29 VITALS — BP 143/86 | HR 114 | Temp 98.5°F | Ht 67.0 in | Wt 198.8 lb

## 2020-06-29 DIAGNOSIS — N898 Other specified noninflammatory disorders of vagina: Secondary | ICD-10-CM

## 2020-06-29 DIAGNOSIS — L989 Disorder of the skin and subcutaneous tissue, unspecified: Secondary | ICD-10-CM

## 2020-06-29 DIAGNOSIS — Z1239 Encounter for other screening for malignant neoplasm of breast: Secondary | ICD-10-CM

## 2020-06-29 DIAGNOSIS — F17200 Nicotine dependence, unspecified, uncomplicated: Secondary | ICD-10-CM

## 2020-06-29 DIAGNOSIS — Z113 Encounter for screening for infections with a predominantly sexual mode of transmission: Secondary | ICD-10-CM | POA: Diagnosis present

## 2020-06-29 DIAGNOSIS — N6320 Unspecified lump in the left breast, unspecified quadrant: Secondary | ICD-10-CM

## 2020-06-29 DIAGNOSIS — Z01419 Encounter for gynecological examination (general) (routine) without abnormal findings: Secondary | ICD-10-CM

## 2020-06-29 DIAGNOSIS — R030 Elevated blood-pressure reading, without diagnosis of hypertension: Secondary | ICD-10-CM

## 2020-06-29 DIAGNOSIS — N6310 Unspecified lump in the right breast, unspecified quadrant: Secondary | ICD-10-CM

## 2020-06-29 DIAGNOSIS — Z124 Encounter for screening for malignant neoplasm of cervix: Secondary | ICD-10-CM

## 2020-06-29 MED ORDER — METRONIDAZOLE 0.75 % VA GEL: 1.0000 | Freq: Every day | VAGINAL | 0 refills | Status: DC

## 2020-06-29 NOTE — Progress Notes (Signed)
GYNECOLOGY OFFICE VISIT NOTE-WELL WOMAN EXAM  History:   Kelly Avila is a 39 year old [id number] here today for "check-up."  Patient reports vaginal discharge that started about 2 weeks ago that is gray, thick, and with odor that she is unable to describe.  She endorses vaginal itching and irritation.  Patient denies issues with urination and constipation or diarrhea.  Patient denies abnormal vaginal bleeding or pelvic pain.  She reports LMP as 05/15/2020 and was "heavier than usual, but regular cramps and nothing different."  Patient states cramps occur first two days and may require tylenol, but not usually.  Patient endorses passing of clots that are "small to medium."    Birth Control: None, Does not desire  Reproductive Concerns: Partners in last year: Two-Condom usage with only one partner STD Testing: Desires Full Testing.  Breast Exams: Does not perform regularly.  Family history of breast, uterine, cervical, or ovarian cancer: Maternal cousin with breast CA x 2-Alive and well. Denies other CA listed.   Medical and Nutrition: PCP: None Exercise: None Tobacco/Drugs/Alcohol: Drink every other weekend-Liquor (Vodka, [place], Etc), [place] Blunts x2 , Cigarettes-1/2 pack Day Nutrition: "I could do better, but" overall patient feels it is good.  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 Medical history non-contributory     Past Surgical History:  Procedure Laterality Date  . ECTOPIC PREGNANCY SURGERY      The following portions of the patient's history were reviewed and updated as appropriate: allergies, current medications, past family history, past medical history, past social history, past surgical history and problem list.   Health Maintenance:  LGSIL pap and Negative HRHPV on 2016.  No mammogram history.   Review of Systems:  Pertinent items noted in HPI and  remainder of comprehensive ROS otherwise negative.    Objective:    Physical Exam BP (!) 143/86 (BP Location: Left Arm, Patient Position: Sitting, Cuff Size: Normal)   Pulse (!) 114   Temp 98.5 F (36.9 C) (Oral)   Ht 5\' 7"  (1.702 m)   Wt 198 lb 12.8 oz (90.2 kg)   LMP 05/15/2020 (Within Days) Comment: Last 5 days  Breastfeeding No   BMI 31.14 kg/m  Physical Exam Exam conducted with a chaperone present.  Constitutional:      Appearance: Normal appearance.  HENT:     Head: Normocephalic and atraumatic.  Eyes:     Conjunctiva/sclera: Conjunctivae normal.  Neck:     Thyroid: No thyroid mass, thyromegaly or thyroid tenderness.  Cardiovascular:     Rate and Rhythm: Normal rate and regular rhythm.     Heart sounds: Normal heart sounds.  Pulmonary:     Effort: Pulmonary effort is normal. No respiratory distress.     Breath sounds: Normal breath sounds.  Chest:  Breasts:     Right: Mass and tenderness present. No nipple discharge or skin change.     Left: Mass and tenderness present. No nipple discharge or skin change.     Abdominal:     General: Bowel sounds are normal.     Palpations: Abdomen is soft.  Genitourinary:    Labia:        Right: No tenderness or lesion.        Left: No tenderness or lesion.      Vagina: Vaginal discharge (thin milky white) present. No bleeding.     Cervix: No friability, lesion, erythema  or cervical bleeding.     Uterus: Not enlarged and not tender.      Comments: Pap collected with brush and spatula. CV collected Musculoskeletal:        General: Normal range of motion.     Cervical back: Normal range of motion.  Skin:    General: Skin is warm and dry.     Comments: Small pinpoint light colored papules noted on torso.   Neurological:     Mental Status: She is alert and oriented to person, place, and time.  Psychiatric:        Mood and Affect: Mood normal.        Behavior: Behavior normal.        Thought Content: Thought content  normal.      Labs and Imaging No results found for this or any previous visit (from the past 168 hour(s)). No results found.   Assessment & Plan:  39 year old Annual Exam with Pap Smear H/O LGSIL Smoker Breast Lump  1. Encounter for routine gynecological examination with Papanicolaou smear of cervix -Exam findings discussed. -Educated on ASCCP guidelines regarding pap smear evaluation and frequency. -Informed of turnover time and provider/clinic policy on releasing results. -Discussed how smoking can contribute to abnormal pap smears.  -Encouraged to activate Mychart. -Educated on AHA exercise recommendations of 30 minutes of moderate to vigorous activity at least 5x/week. -Assessed readiness for smoking cessation. Patient declines assistance with cessation.   2. Encounter for screening breast examination -Educated and encouraged to initiate monthly SBE with increased breast awareness including examination of breast for skin changes, moles, tenderness, etc.   3. Elevated blood pressure reading without diagnosis of hypertension -Discussed elevated blood pressure. -Instructed to obtain PCP for further evaluation and necessary mgmt.  4. Vaginal discharge -C/w bacterial vaginosis. -Will treat with Metrogel. -CV collected  5. Vaginal odor -CV collected  6. Screen for STD (sexually transmitted disease) -Per Patient request.   7. Bilateral breast lump -Reviewed findings. -Will send for diagnostic mammogram.  8. Skin disorder -Dermatology referral placed   Routine preventative health maintenance measures emphasized. Please refer to After Visit Summary for other counseling recommendations.   No follow-ups on file.      Cherre Robins, CNM 06/29/2020

## 2020-07-02 LAB — CERVICOVAGINAL ANCILLARY ONLY
Bacterial Vaginitis (gardnerella): POSITIVE — AB
Candida Glabrata: NEGATIVE
Candida Vaginitis: NEGATIVE
Chlamydia: NEGATIVE
Comment: NEGATIVE
Comment: NEGATIVE
Comment: NEGATIVE
Comment: NEGATIVE
Comment: NEGATIVE
Comment: NORMAL
Neisseria Gonorrhea: NEGATIVE
Trichomonas: NEGATIVE

## 2020-07-03 ENCOUNTER — Telehealth: Payer: Self-pay

## 2020-07-03 LAB — CYTOLOGY - PAP
Comment: NEGATIVE
Diagnosis: UNDETERMINED — AB
High risk HPV: NEGATIVE

## 2020-07-03 LAB — RPR+HBSAG+HCVAB+...
HIV Screen 4th Generation wRfx: NONREACTIVE
Hep C Virus Ab: <0.1 s/co ratio (ref 0.0–0.9)
Hepatitis B Surface Ag: NEGATIVE
RPR Ser Ql: REACTIVE — AB

## 2020-07-03 LAB — RPR, QUANT+TP ABS (REFLEX)
Rapid Plasma Reagin, Quant: 1:1 {titer} — ABNORMAL HIGH
T Pallidum Abs: NONREACTIVE

## 2020-07-03 NOTE — Telephone Encounter (Signed)
Kelly Avila February 02, 1981  Attempted to contact patient regarding RPR results and inform of likely false positive in absence of prior history.  Message left for patient to contact office or send mychart message if desiring phone call to discuss results.   Cherre Robins MSN, CNM Advanced Practice Provider, [facility] [employer]

## 2020-07-12 ENCOUNTER — Ambulatory Visit
Admission: RE | Admit: 2020-07-12 | Discharge: 2020-07-12 | Disposition: A | Discharge status: Home / Self Care | Payer: BC Managed Care – PPO | Source: Ambulatory Visit

## 2020-07-12 ENCOUNTER — Other Ambulatory Visit: Payer: Self-pay

## 2020-07-12 DIAGNOSIS — Z1239 Encounter for other screening for malignant neoplasm of breast: Secondary | ICD-10-CM

## 2020-07-12 DIAGNOSIS — Z01419 Encounter for gynecological examination (general) (routine) without abnormal findings: Secondary | ICD-10-CM

## 2020-07-12 DIAGNOSIS — N6320 Unspecified lump in the left breast, unspecified quadrant: Secondary | ICD-10-CM

## 2020-08-02 ENCOUNTER — Encounter (HOSPITAL_COMMUNITY): Payer: Self-pay

## 2020-08-02 ENCOUNTER — Other Ambulatory Visit: Payer: Self-pay

## 2020-08-02 ENCOUNTER — Ambulatory Visit (HOSPITAL_COMMUNITY): Payer: Self-pay

## 2020-08-02 ENCOUNTER — Ambulatory Visit (HOSPITAL_COMMUNITY)
Admission: EM | Admit: 2020-08-02 | Discharge: 2020-08-02 | Disposition: A | Discharge status: Home / Self Care | Payer: BC Managed Care – PPO | Attending: Family Medicine | Admitting: Family Medicine

## 2020-08-02 DIAGNOSIS — R220 Localized swelling, mass and lump, head: Secondary | ICD-10-CM

## 2020-08-02 MED ORDER — PREDNISONE 50 MG PO TABS: ORAL_TABLET | ORAL | 0 refills | Status: DC

## 2020-08-02 MED ORDER — CETIRIZINE HCL 10 MG PO TABS: 10.0000 mg | ORAL_TABLET | Freq: Every day | ORAL | 0 refills | Status: DC

## 2020-08-02 NOTE — ED Triage Notes (Signed)
Pt presents with bumps on bottom gums. She states the bumps appeared yesterday. Pt states her face was swollen yesterday and did not take take OTC medicine to reduce the swelling. She denies itching on the gums.

## 2020-08-02 NOTE — ED Provider Notes (Signed)
[facility]    CSN: [id number] Arrival date & time: 08/02/20  0803      History   Chief Complaint Chief Complaint  Patient presents with  . Bump    On gum      HPI Michalle Rademaker is a 40 y.o. female.   Here today with 1 day history of painless gum swelling, mild facial swelling b/l that happened last night, improved this morning but still lingering some. Denies fever, chills, itching, throat swelling, dysphagia, trouble breathing, new exposures, prescription medication use or supplement use, hx of same. Not trying anything OTC for sxs. No known dental issues.      Past Medical History:  Diagnosis Date  . Medical history non-contributory     Patient Active Problem List   Diagnosis Date Noted  . Marijuana use 04/08/2015  . History of unilateral salpingectomy 04/03/2015    Past Surgical History:  Procedure Laterality Date  . ECTOPIC PREGNANCY SURGERY      OB History    Gravida  3   Para  2   Term  2   Preterm      AB  1   Living  2     SAB      IAB      Ectopic  1   Multiple  0   Live Births  2            Home Medications    Prior to Admission medications   Medication Sig Start Date End Date Taking? Authorizing Provider  cetirizine (ZYRTEC ALLERGY) 10 MG tablet Take 1 tablet (10 mg total) by mouth daily. 08/02/20  Yes Particia Nearing, PA-C  predniSONE (DELTASONE) 50 MG tablet Take 1 tab daily with breakfast for 5 days 08/02/20  Yes Particia Nearing, PA-C  metroNIDAZOLE (METROGEL VAGINAL) 0.75 % vaginal gel Place 1 Applicatorful vaginally at bedtime. Insert one applicator, at bedtime, for 5 nights. 06/29/20   Gerrit Heck, CNM    Family History Family History  Problem Relation Age of Onset  . Diabetes Paternal Grandmother   . Heart Problems Father   . Hypertension Mother   . Diabetes Maternal Aunt     Social History Social History   Tobacco Use  . Smoking status: Current Every Day Smoker    Packs/day:  0.50    Types: Cigarettes  . Smokeless tobacco: Never Used  Vaping Use  . Vaping Use: Never used  Substance Use Topics  . Alcohol use: Yes    Comment: socially  . Drug use: Yes    Types: Marijuana     Allergies   Patient has no known allergies.   Review of Systems Review of Systems PER HPI    Physical Exam Triage Vital Signs ED Triage Vitals  Enc Vitals Group     BP 08/02/20 0844 134/81     Pulse Rate 08/02/20 0844 88     Resp 08/02/20 0844 17     Temp 08/02/20 0844 98.6 F (37 C)     Temp Source 08/02/20 0844 Oral     SpO2 08/02/20 0844 100 %     Weight --      Height --      Head Circumference --      Peak Flow --      Pain Score 08/02/20 0843 0     Pain Loc --      Pain Edu? --      Excl. in GC? --  No data found.  Updated Vital Signs BP 134/81 (BP Location: Left Arm)   Pulse 88   Temp 98.6 F (37 C) (Oral)   Resp 17   LMP 06/18/2020 (Approximate)   SpO2 100%   Visual Acuity Right Eye Distance:   Left Eye Distance:   Bilateral Distance:    Right Eye Near:   Left Eye Near:    Bilateral Near:     Physical Exam Vitals and nursing note reviewed.  Constitutional:      Appearance: Normal appearance. She is not ill-appearing.  HENT:     Head: Atraumatic.     Nose: Nose normal.     Mouth/Throat:     Mouth: Mucous membranes are moist.     Pharynx: No oropharyngeal exudate or posterior oropharyngeal erythema.     Comments: Diffuse gingival edema, no erythema, ttp, drainage, evidence of infection. Good dentition diffusely Eyes:     Extraocular Movements: Extraocular movements intact.     Conjunctiva/sclera: Conjunctivae normal.  Cardiovascular:     Rate and Rhythm: Normal rate and regular rhythm.     Heart sounds: Normal heart sounds.  Pulmonary:     Effort: Pulmonary effort is normal.     Breath sounds: Normal breath sounds.  Musculoskeletal:        General: Normal range of motion.     Cervical back: Normal range of motion and neck  supple.  Skin:    General: Skin is warm and dry.  Neurological:     Mental Status: She is alert and oriented to person, place, and time.  Psychiatric:        Mood and Affect: Mood normal.        Thought Content: Thought content normal.        Judgment: Judgment normal.      UC Treatments / Results  Labs (all labs ordered are listed, but only abnormal results are displayed) Labs Reviewed - No data to display  EKG   Radiology No results found.  Procedures Procedures (including critical care time)  Medications Ordered in UC Medications - No data to display  Initial Impression / Assessment and Plan / UC Course  I have reviewed the triage vital signs and the nursing notes.  Pertinent labs & imaging results that were available during my care of the patient were reviewed by me and considered in my medical decision making (see chart for details).     Appears more inflammatory/allergic than infectious at this point. Discussed taking inventory of home products to see if anything is new or may be causative including chapsticks, supplements, toothpastes. Will treat with prednisone, antihistamines and salt water gargles. F/u with dentist if not improving, ED if worsening and trouble breathing or swallowing.   Final Clinical Impressions(s) / UC Diagnoses   Final diagnoses:  Swelling of gums   Discharge Instructions   None    ED Prescriptions    Medication Sig Dispense Auth. Provider   predniSONE (DELTASONE) 50 MG tablet Take 1 tab daily with breakfast for 5 days 5 tablet Particia Nearing, PA-C   cetirizine (ZYRTEC ALLERGY) 10 MG tablet Take 1 tablet (10 mg total) by mouth daily. 30 tablet Particia Nearing, [place]     PDMP not reviewed this encounter.   Roosvelt Maser [place], [place] 08/02/20 [phone]

## 2020-08-21 ENCOUNTER — Ambulatory Visit (HOSPITAL_COMMUNITY): Payer: Self-pay

## 2020-08-25 ENCOUNTER — Other Ambulatory Visit: Payer: Self-pay | Admitting: Family Medicine

## 2020-09-28 ENCOUNTER — Ambulatory Visit (HOSPITAL_COMMUNITY): Payer: Self-pay

## 2020-10-17 ENCOUNTER — Ambulatory Visit (HOSPITAL_COMMUNITY): Payer: Self-pay

## 2020-11-27 ENCOUNTER — Ambulatory Visit: Payer: BC Managed Care – PPO | Admitting: Physician Assistant

## 2021-01-27 ENCOUNTER — Other Ambulatory Visit: Payer: Self-pay

## 2021-01-27 ENCOUNTER — Emergency Department (HOSPITAL_COMMUNITY)
Admission: EM | Admit: 2021-01-27 | Discharge: 2021-01-27 | Disposition: A | Discharge status: Home / Self Care | Payer: Medicaid Other | Attending: Emergency Medicine | Admitting: Emergency Medicine

## 2021-01-27 ENCOUNTER — Encounter (HOSPITAL_COMMUNITY): Payer: Self-pay | Admitting: Oncology

## 2021-01-27 DIAGNOSIS — R112 Nausea with vomiting, unspecified: Secondary | ICD-10-CM | POA: Diagnosis not present

## 2021-01-27 DIAGNOSIS — N9489 Other specified conditions associated with female genital organs and menstrual cycle: Secondary | ICD-10-CM | POA: Insufficient documentation

## 2021-01-27 DIAGNOSIS — Z28310 Unvaccinated for covid-19: Secondary | ICD-10-CM | POA: Insufficient documentation

## 2021-01-27 DIAGNOSIS — U071 COVID-19: Secondary | ICD-10-CM | POA: Insufficient documentation

## 2021-01-27 DIAGNOSIS — F17210 Nicotine dependence, cigarettes, uncomplicated: Secondary | ICD-10-CM | POA: Insufficient documentation

## 2021-01-27 DIAGNOSIS — R000 Tachycardia, unspecified: Secondary | ICD-10-CM | POA: Diagnosis not present

## 2021-01-27 DIAGNOSIS — R519 Headache, unspecified: Secondary | ICD-10-CM | POA: Diagnosis present

## 2021-01-27 LAB — RESP PANEL BY RT-PCR (FLU A&B, COVID) ARPGX2
Influenza A by PCR: NEGATIVE
Influenza B by PCR: NEGATIVE
SARS Coronavirus 2 by RT PCR: POSITIVE — AB

## 2021-01-27 LAB — I-STAT BETA HCG BLOOD, ED (MC, WL, AP ONLY): I-stat hCG, quantitative: <5 m[IU]/mL (ref <5)

## 2021-01-27 MED ORDER — ONDANSETRON 4 MG PO TBDP: 4.0000 mg | ORAL_TABLET | Freq: Three times a day (TID) | ORAL | 0 refills | Status: DC | PRN

## 2021-01-27 MED ORDER — PAXLOVID 20 X 150 MG & 10 X 100MG PO TBPK: 1.0000 | ORAL_TABLET | Freq: Two times a day (BID) | ORAL | 0 refills | Status: AC

## 2021-01-27 MED ORDER — ONDANSETRON 4 MG PO TBDP
4.0000 mg | ORAL_TABLET | Freq: Once | ORAL | Status: AC
  Administered 2021-01-27: 4 mg via ORAL
  Filled 2021-01-27: qty 1

## 2021-01-27 MED ORDER — ACETAMINOPHEN 500 MG PO TABS
1000.0000 mg | ORAL_TABLET | Freq: Once | ORAL | Status: AC
  Administered 2021-01-27: 1000 mg via ORAL
  Filled 2021-01-27: qty 2

## 2021-01-27 MED ORDER — KETOROLAC TROMETHAMINE 15 MG/ML IJ SOLN
15.0000 mg | Freq: Once | INTRAMUSCULAR | Status: AC
  Administered 2021-01-27: 15 mg via INTRAMUSCULAR
  Filled 2021-01-27: qty 1

## 2021-01-27 NOTE — ED Provider Notes (Addendum)
[facility]-EMERGENCY DEPT Provider Note   CSN: [id number] Arrival date & time: 01/27/21  1324     History No chief complaint on file.   Kelly Avila is a 40 y.o. female presenting for evaluation of headache, nausea, vomiting, body aches, cough.  Patient states she started to feel poorly yesterday.  She took a home COVID test that came back positive today.  She took Tylenol PM last night without improvement of symptoms.  Has not taken anything else.  She reports generalized body aches, headache, vomiting this morning, and a cough productive of sputum.  She denies chest pain, shortness of breath, abdominal pain, urinary symptoms.  She is not vaccinated for COVID.  She denies sick contacts.  She has no medical problems, takes no medications daily.  No history of pulmonary problems.   HPI     Past Medical History:  Diagnosis Date   Medical history non-contributory     Patient Active Problem List   Diagnosis Date Noted   Marijuana use 04/08/2015   History of unilateral salpingectomy 04/03/2015    Past Surgical History:  Procedure Laterality Date   ECTOPIC PREGNANCY SURGERY       OB History     Gravida  3   Para  2   Term  2   Preterm      AB  1   Living  2      SAB      IAB      Ectopic  1   Multiple  0   Live Births  2           Family History  Problem Relation Age of Onset   Diabetes Paternal Grandmother    Heart Problems Father    Hypertension Mother    Diabetes Maternal Aunt     Social History   Tobacco Use   Smoking status: Every Day    Packs/day: 0.50    Types: Cigarettes   Smokeless tobacco: Never  Vaping Use   Vaping Use: Never used  Substance Use Topics   Alcohol use: Yes    Comment: socially   Drug use: Yes    Types: Marijuana    Home Medications Prior to Admission medications   Medication Sig Start Date End Date Taking? Authorizing Provider  diphenhydramine-acetaminophen (TYLENOL PM) 25-500 MG  TABS tablet Take 1 tablet by mouth at bedtime as needed (fever).   Yes [provider]  Nirmatrelvir & Ritonavir (PAXLOVID) 20 x 150 MG & 10 x 100MG  TBPK Take 1 tablet by mouth in the morning and at bedtime for 5 days. Take medication as instructed on the pack. 01/27/21 02/01/21 Yes Taris Galindo, PA-C  ondansetron (ZOFRAN ODT) 4 MG disintegrating tablet Take 1 tablet (4 mg total) by mouth every 8 (eight) hours as needed for nausea or vomiting. 01/27/21  Yes Beulah Capobianco, PA-C  cetirizine (ZYRTEC ALLERGY) 10 MG tablet Take 1 tablet (10 mg total) by mouth daily. Patient not taking: Reported on 01/27/2021 08/02/20   08/04/20, PA-C  metroNIDAZOLE (METROGEL VAGINAL) 0.75 % vaginal gel Place 1 Applicatorful vaginally at bedtime. Insert one applicator, at bedtime, for 5 nights. Patient not taking: Reported on 01/27/2021 06/29/20   07/01/20, CNM  predniSONE (DELTASONE) 50 MG tablet Take 1 tab daily with breakfast for 5 days Patient not taking: Reported on 01/27/2021 08/02/20   08/04/20, PA-C    Allergies    Patient has no known allergies.  Review of Systems  Review of Systems  Respiratory:  Positive for cough.   Gastrointestinal:  Positive for nausea and vomiting.  Musculoskeletal:  Positive for myalgias.  Neurological:  Positive for headaches.  All other systems reviewed and are negative.  Physical Exam Updated Vital Signs BP 130/79   Pulse (!) 103   Temp 98.8 F (37.1 C) (Oral)   Resp (!) 24   Ht 5\' 7"  (1.702 m)   Wt 86.2 kg   LMP 01/13/2021 (Approximate)   SpO2 96%   BMI 29.76 kg/m   Physical Exam Vitals and nursing note reviewed.  Constitutional:      General: She is not in acute distress.    Appearance: Normal appearance.     Comments: Appears nontoxic  HENT:     Head: Normocephalic and atraumatic.  Eyes:     Extraocular Movements: Extraocular movements intact.     Conjunctiva/sclera: Conjunctivae normal.     Pupils: Pupils are  equal, round, and reactive to light.  Cardiovascular:     Rate and Rhythm: Regular rhythm. Tachycardia present.     Pulses: Normal pulses.     Comments: Mildly tachycardic around 105 Pulmonary:     Effort: Pulmonary effort is normal. No respiratory distress.     Breath sounds: Normal breath sounds. No wheezing.     Comments: Speaking in full sentences.  Clear lung sounds in all fields. SPO2 stable on RA Abdominal:     General: There is no distension.     Palpations: Abdomen is soft. There is no mass.     Tenderness: There is no abdominal tenderness. There is no guarding or rebound.  Musculoskeletal:        General: Normal range of motion.     Cervical back: Normal range of motion and neck supple.  Skin:    General: Skin is warm and dry.     Capillary Refill: Capillary refill takes less than 2 seconds.  Neurological:     Mental Status: She is alert and oriented to person, place, and time.  Psychiatric:        Mood and Affect: Mood and affect normal.        Speech: Speech normal.        Behavior: Behavior normal.    ED Results / Procedures / Treatments   Labs (all labs ordered are listed, but only abnormal results are displayed) Labs Reviewed  RESP PANEL BY RT-PCR (FLU A&B, COVID) ARPGX2 - Abnormal; Notable for the following components:      Result Value   SARS Coronavirus 2 by RT PCR POSITIVE (*)    All other components within normal limits  I-STAT CHEM 8, ED  I-STAT BETA HCG BLOOD, ED ([facility], [facility], [facility] ONLY)       EKG None  Radiology No results found.  Procedures Procedures   Medications Ordered in ED Medications  ondansetron (ZOFRAN-ODT) disintegrating tablet 4 mg (4 mg Oral Given 01/27/21 1125)  ketorolac (TORADOL) 15 MG/ML injection 15 mg (15 mg Intramuscular Given 01/27/21 1125)  acetaminophen (TYLENOL) tablet 1,000 mg (1,000 mg Oral Given 01/27/21 1125)    ED Course  I have reviewed the triage vital signs and the nursing notes.  Pertinent labs & imaging  results that were available during my care of the patient were reviewed by me and considered in my medical decision making (see chart for details).    MDM Rules/Calculators/A&P  Patient presenting for evaluation of 24 hours of viral symptoms.  Home COVID test positive.  On exam, patient appears nontoxic.  She is mildly tachycardic, however not warm to the touch, suspect low-grade fever.  Additionally, she is likely dehydrated due to vomiting and decreased p.o. intake.  Will treat symptomatically and reassess.  On reevaluation, pt states she is feeling much better. HR and RR improved on repeat exam (102 and 22 respectively). She states she feels well enough to go home. SCr is reassuring at 1.2. hcg negative.  Discussed option of antiviral therapy as patient has obesity is a risk factor.  She would like to do this.  Discussed isolation instructions and continued symptomatic management.  At this time, patient appear safe for discharge.  Return precautions given.  Patient states she understands and agrees to plan.    Kelly Avila was evaluated in Emergency Department on 01/27/2021 for the symptoms described in the history of present illness. She was evaluated in the context of the global COVID-19 pandemic, which necessitated consideration that the patient might be at risk for infection with the SARS-CoV-2 virus that causes COVID-19. Institutional protocols and algorithms that pertain to the evaluation of patients at risk for COVID-19 are in a state of rapid change based on information released by regulatory bodies including the CDC and federal and state organizations. These policies and algorithms were followed during the patient's care in the ED.   Final Clinical Impression(s) / ED Diagnoses Final diagnoses:  COVID-19    Rx / DC Orders ED Discharge Orders          Ordered    Nirmatrelvir & Ritonavir (PAXLOVID) 20 x 150 MG & 10 x 100MG  TBPK  2 times daily        01/27/21  1310    ondansetron (ZOFRAN ODT) 4 MG disintegrating tablet  Every 8 hours PRN        01/27/21 1310             Rakiya Krawczyk, PA-C 01/27/21 1316    Tadhg Eskew, PA-C 01/27/21 1325    01/29/21, MD 01/27/21 1454

## 2021-01-27 NOTE — ED Triage Notes (Signed)
Pt presents d/t generalized body aches, HA, N/V x 24 hours.  States she took an at home COVID test that came back positive.  States she has tylenol PM and has not been effective.

## 2021-01-27 NOTE — Discharge Instructions (Addendum)
Take Paxlovid as prescribed. Use Zofran as needed for nausea or vomiting. Use Tylenol and ibuprofen up to 3 times a day as needed for headache, fever, or body aches.   Make sure to stay well-hydrated with water. You will need to isolate for 5 days from symptom onset. You may end isolation after 5 days if you are fever free for 25 hours. After this, you will need to wear a well fitting mask for another 5 days when in public.  Return to the ER if you develop any new, worsening, or concerning symptoms.

## 2021-01-27 NOTE — ED Notes (Signed)
Pt ISTAT Chem8 results handed to Dr. Freida Busman, MD due to test not resulting in pt chart.

## 2021-01-28 LAB — I-STAT CHEM 8, ED
BUN: 6 mg/dL (ref 6–20)
Calcium, Ion: 1.16 mmol/L (ref 1.15–1.40)
Chloride: 101 mmol/L (ref 98–111)
Creatinine, Ser: 1.2 mg/dL — ABNORMAL HIGH (ref 0.44–1.00)
Glucose, Bld: 112 mg/dL — ABNORMAL HIGH (ref 70–99)
HCT: 40 % (ref 36.0–46.0)
Hemoglobin: 13.6 g/dL (ref 12.0–15.0)
Potassium: 3.4 mmol/L — ABNORMAL LOW (ref 3.5–5.1)
Sodium: 136 mmol/L (ref 135–145)
TCO2: 22 mmol/L (ref 22–32)

## 2021-02-12 ENCOUNTER — Ambulatory Visit (HOSPITAL_COMMUNITY): Payer: Self-pay

## 2021-10-03 IMAGING — MG DIGITAL DIAGNOSTIC BILAT W/ TOMO W/ CAD
6 of 10 series · 6 of 30 positions shown · non-contrast
Comparison: None.

CLINICAL DATA: 39-year-old female with bilateral physician palpated
abnormalities.

EXAM:
DIGITAL DIAGNOSTIC BILATERAL MAMMOGRAM WITH CAD AND TOMO
ULTRASOUND BILATERAL BREAST

[R MLO synth-2D]
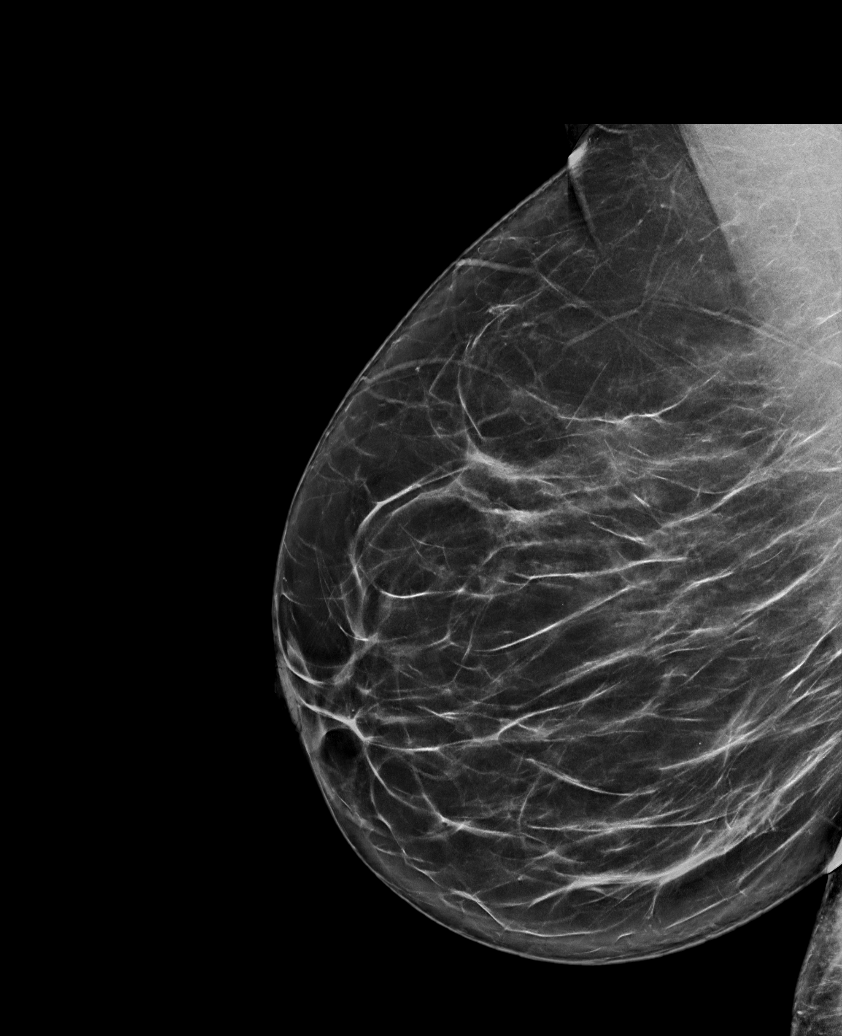

[L CC synth-2D]
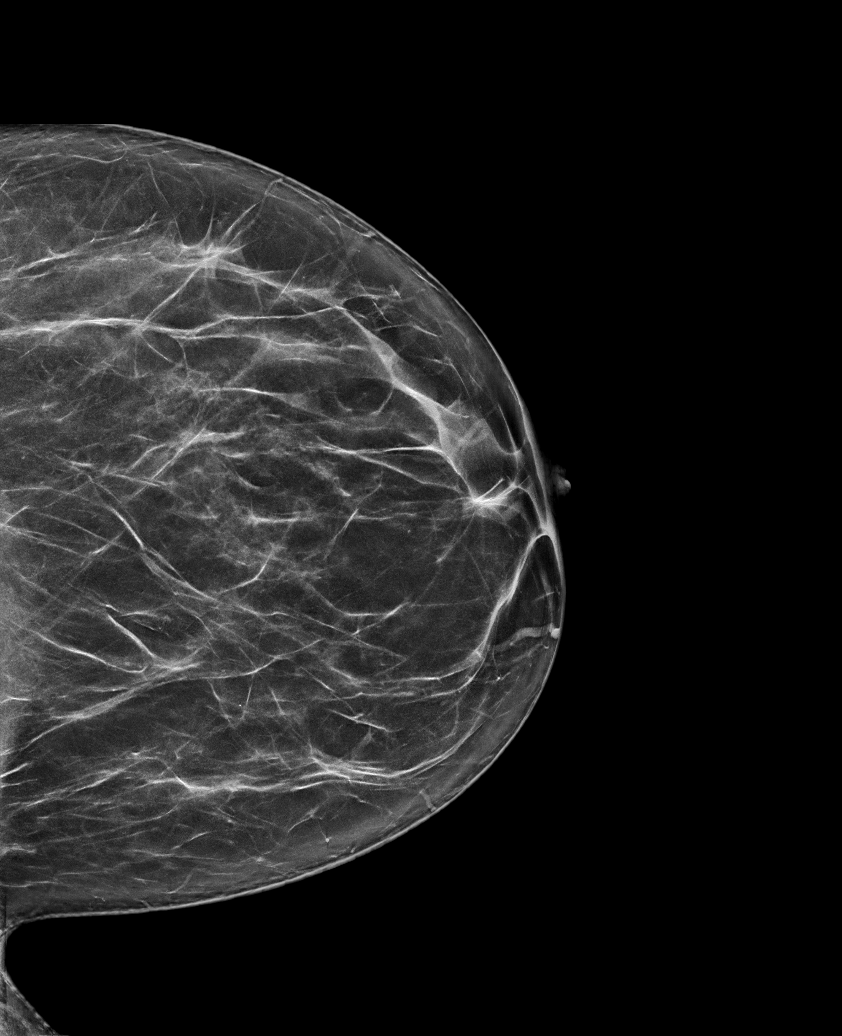

[L MLO synth-2D (1 of 2)]
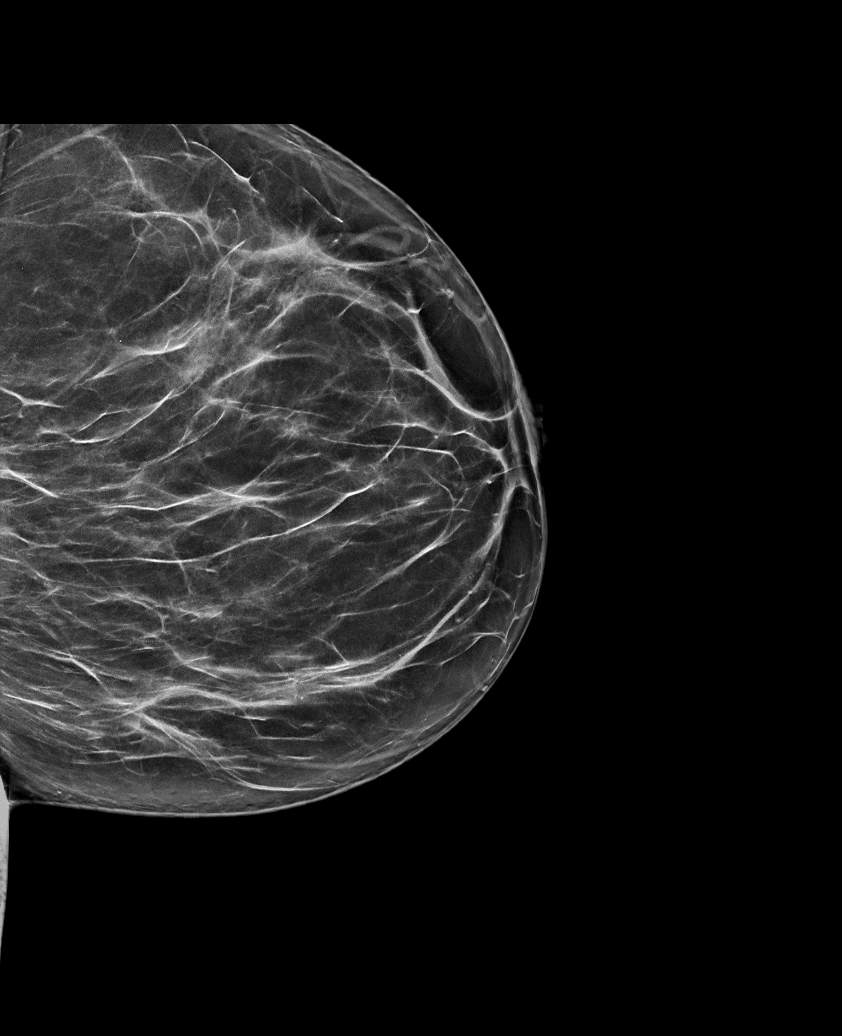

[R CC synth-2D]
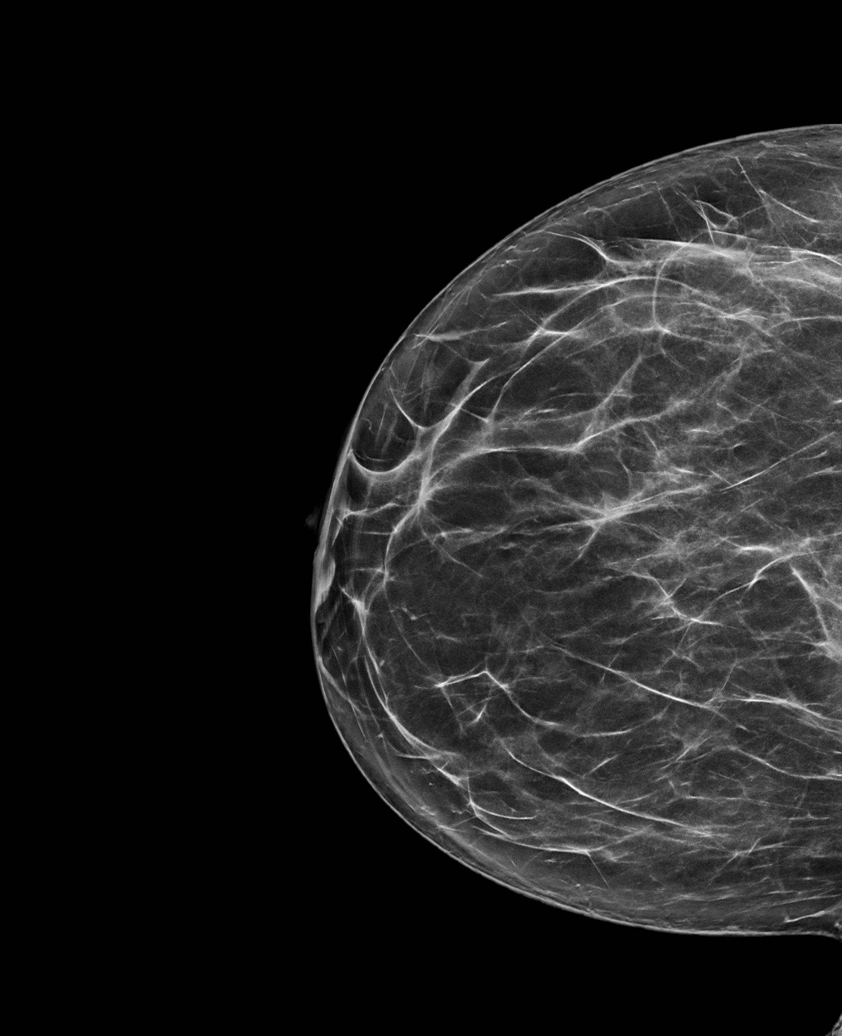

[L MLO synth-2D (2 of 2)]
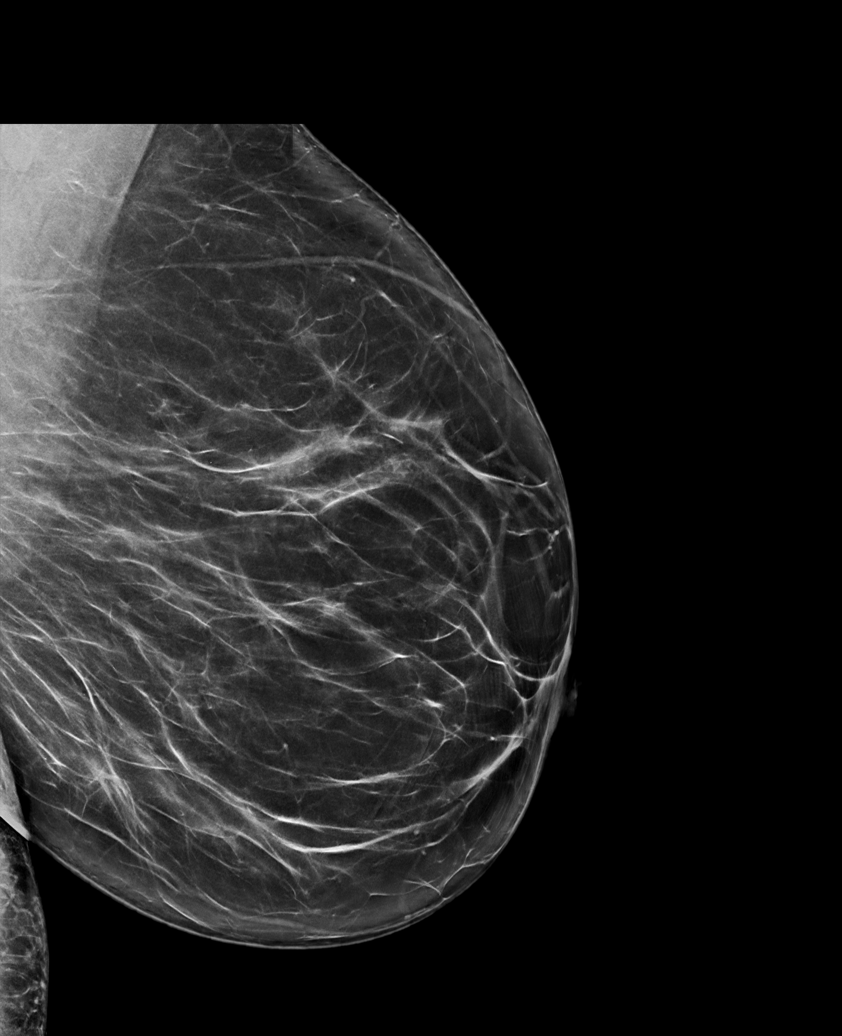

[R MLO tomo · tomo slice 43/85.0]
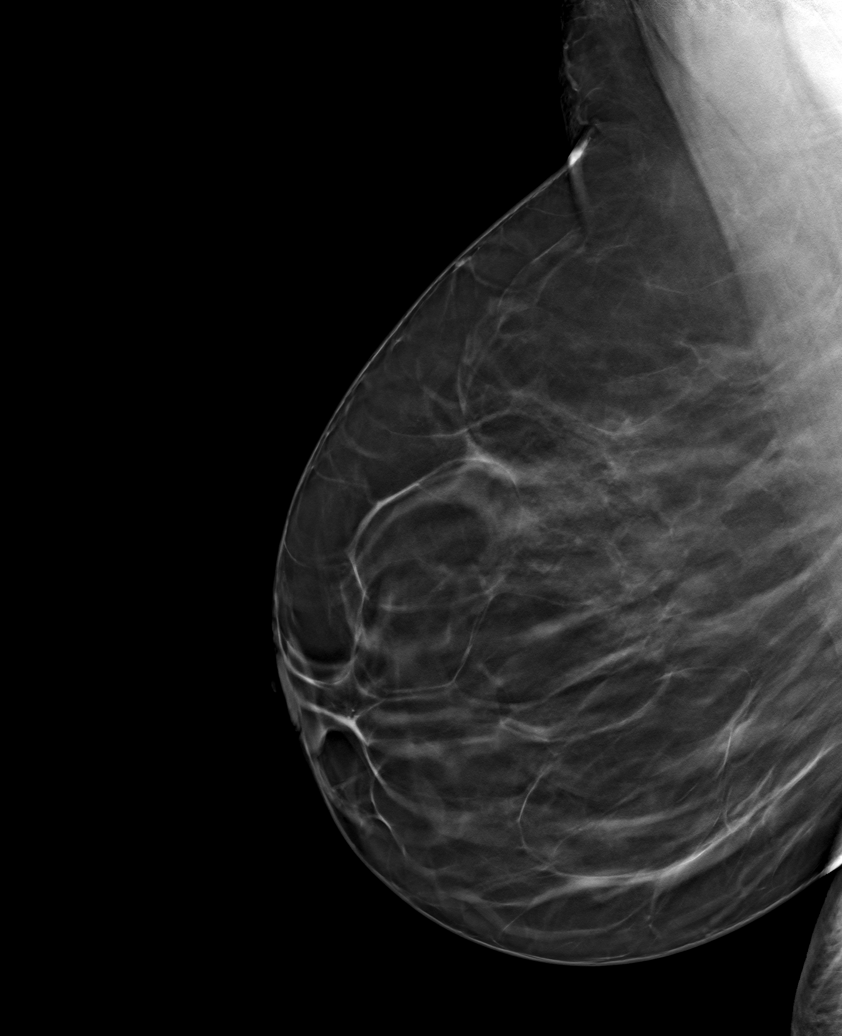

[6 of 30 positions shown; findings below may reference images not displayed]

ACR Breast Density Category b: There are scattered areas of
fibroglandular density.
FINDINGS: No suspicious mammographic findings in either breast.

Mammographic images were processed with CAD.

Targeted ultrasound is performed, showing normal fibroglandular
tissue without focal or suspicious sonographic abnormality.
Evaluation of the inferior right breast and the inferior and upper
outer left breast were performed.
IMPRESSION: No suspicious mammographic or sonographic findings in either breast.

RECOMMENDATION:
Screening mammogram in one year.(Code:YX-O-IJA)

I have discussed the findings and recommendations with the patient.
If applicable, a reminder letter will be sent to the patient
regarding the next appointment.

BI-RADS CATEGORY  1: Negative.

## 2021-10-03 IMAGING — US US BREAST*L* LIMITED INC AXILLA
1 series · 6 of 6 positions shown · non-contrast
Comparison: None.

CLINICAL DATA: 39-year-old female with bilateral physician palpated
abnormalities.

EXAM:
DIGITAL DIAGNOSTIC BILATERAL MAMMOGRAM WITH CAD AND TOMO
ULTRASOUND BILATERAL BREAST

[Series 1: us breast*left* limited inc axilla · 0.08mm/px · 6 of 6 slices shown]
[im 1/6]
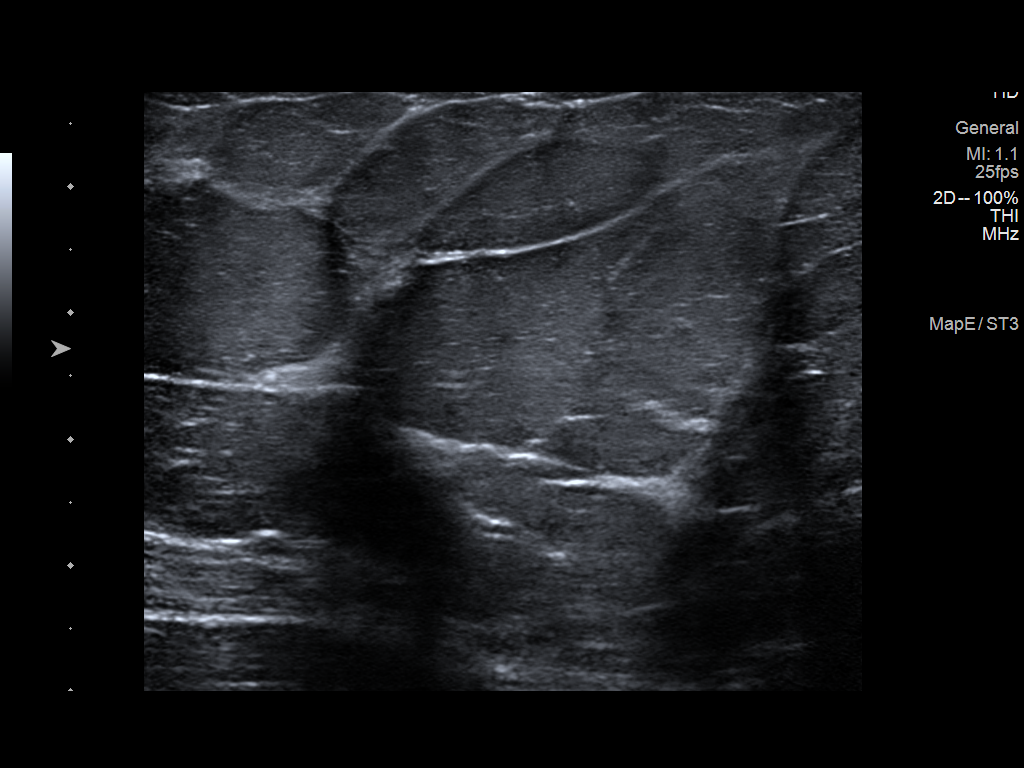
[im 2/6]
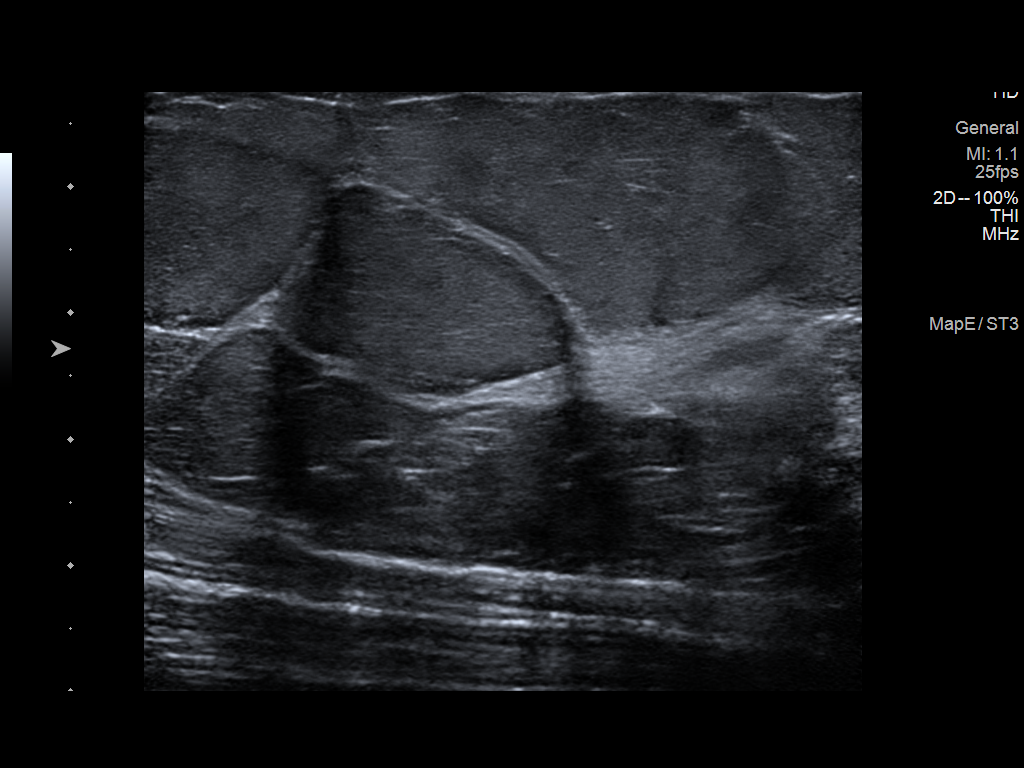
[im 3/6]
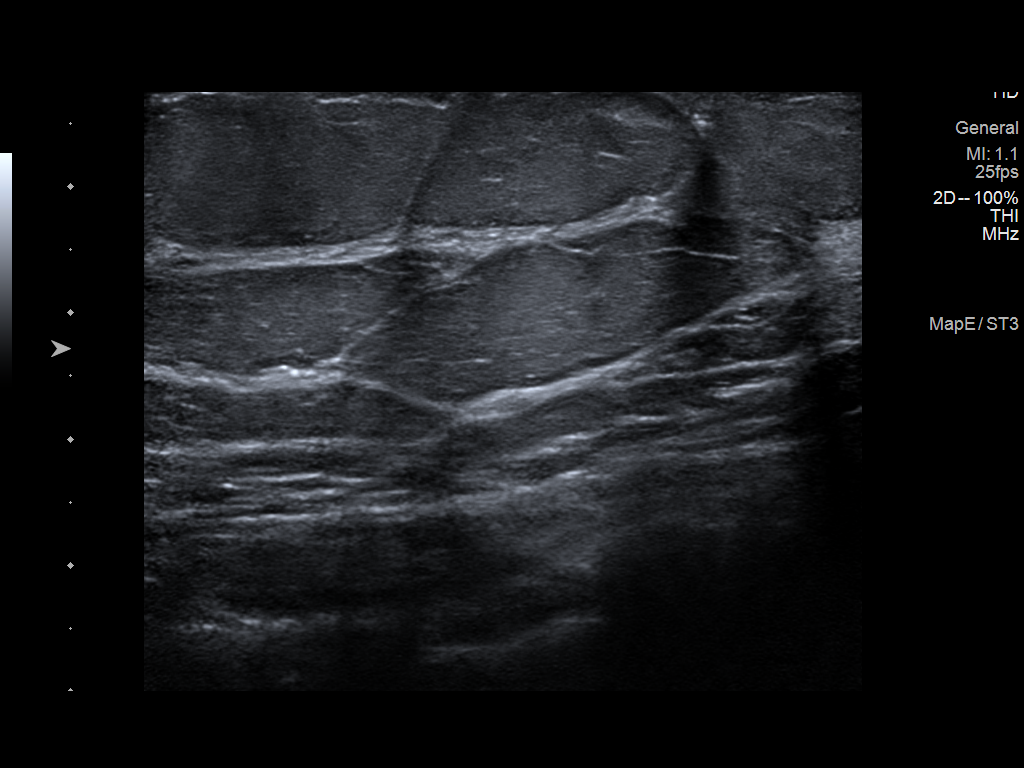
[im 4/6]
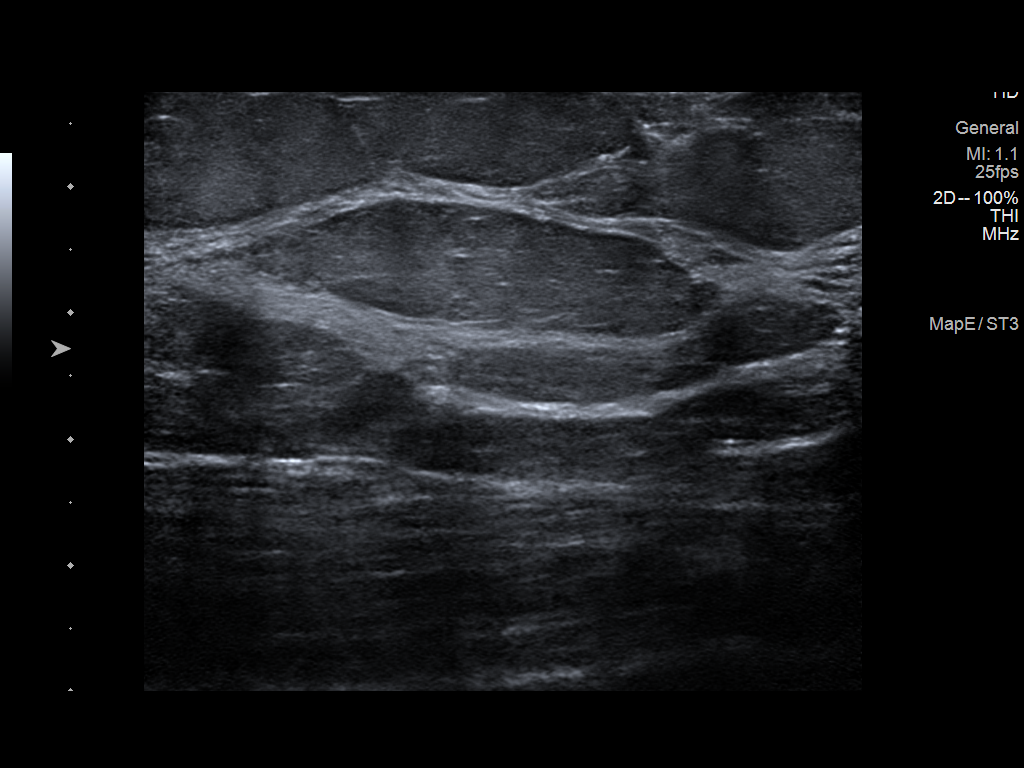
[im 5/6]
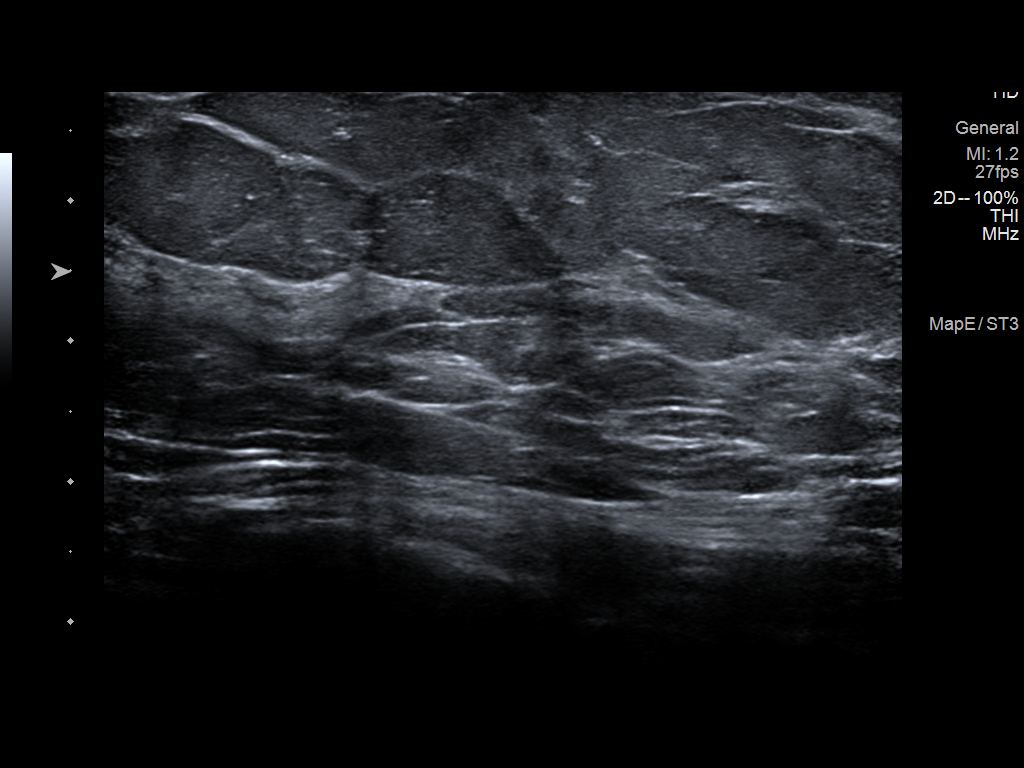
[im 6/6]
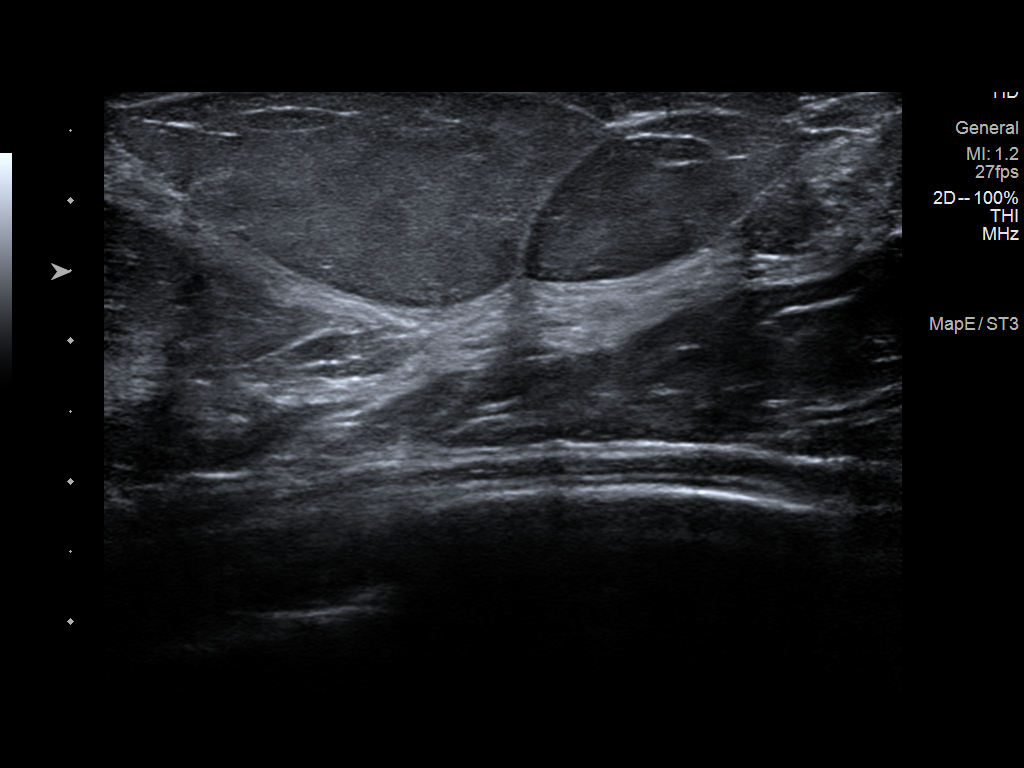

[6 of 6 positions shown; findings below may reference images not displayed]

ACR Breast Density Category b: There are scattered areas of
fibroglandular density.
FINDINGS: No suspicious mammographic findings in either breast.

Mammographic images were processed with CAD.

Targeted ultrasound is performed, showing normal fibroglandular
tissue without focal or suspicious sonographic abnormality.
Evaluation of the inferior right breast and the inferior and upper
outer left breast were performed.
IMPRESSION: No suspicious mammographic or sonographic findings in either breast.

RECOMMENDATION:
Screening mammogram in one year.(Code:YX-O-IJA)

I have discussed the findings and recommendations with the patient.
If applicable, a reminder letter will be sent to the patient
regarding the next appointment.

BI-RADS CATEGORY  1: Negative.

## 2021-10-03 IMAGING — US US BREAST*R* LIMITED INC AXILLA
1 series · 2 of 2 positions shown · non-contrast
Comparison: None.

CLINICAL DATA: 39-year-old female with bilateral physician palpated
abnormalities.

EXAM:
DIGITAL DIAGNOSTIC BILATERAL MAMMOGRAM WITH CAD AND TOMO
ULTRASOUND BILATERAL BREAST

[Series 1: us breast*right* limited inc axilla · 0.09mm/px · 2 of 2 slices shown]
[im 1/2]
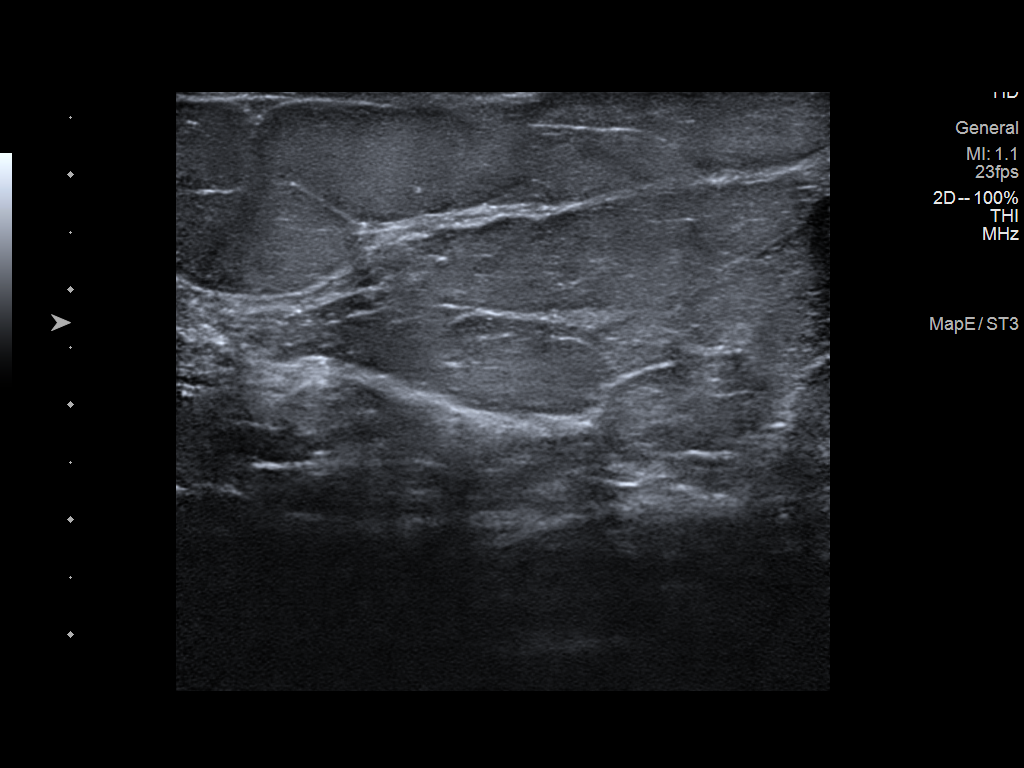
[im 2/2]
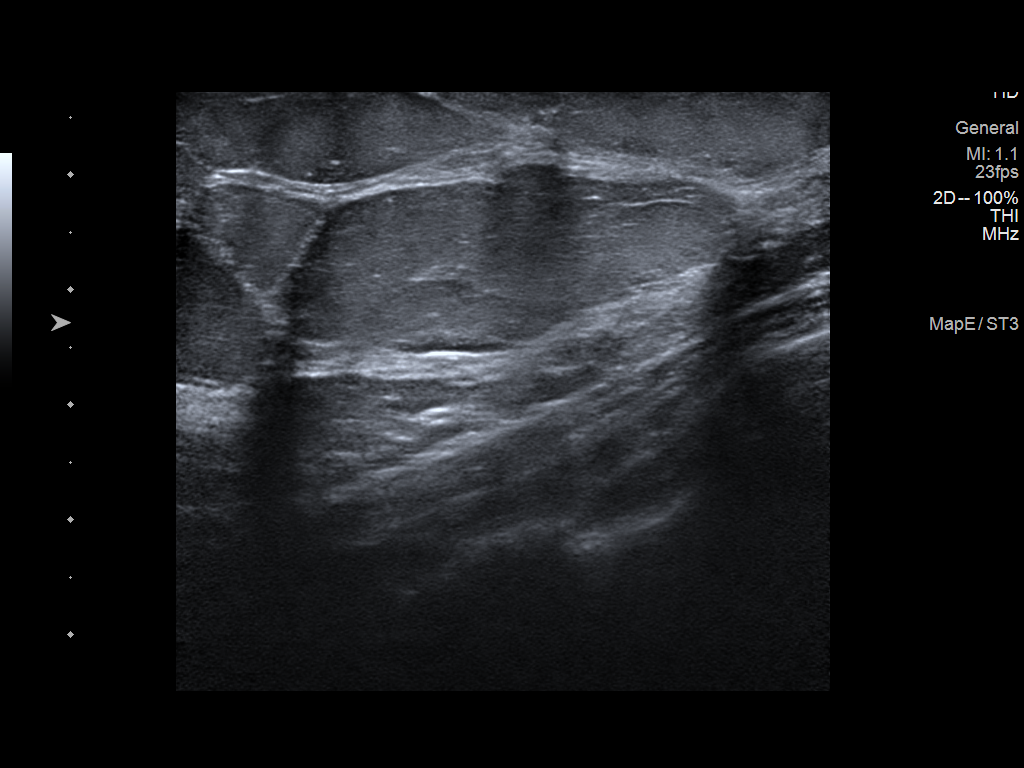

[2 of 2 positions shown; findings below may reference images not displayed]

ACR Breast Density Category b: There are scattered areas of
fibroglandular density.
FINDINGS: No suspicious mammographic findings in either breast.

Mammographic images were processed with CAD.

Targeted ultrasound is performed, showing normal fibroglandular
tissue without focal or suspicious sonographic abnormality.
Evaluation of the inferior right breast and the inferior and upper
outer left breast were performed.
IMPRESSION: No suspicious mammographic or sonographic findings in either breast.

RECOMMENDATION:
Screening mammogram in one year.(Code:YX-O-IJA)

I have discussed the findings and recommendations with the patient.
If applicable, a reminder letter will be sent to the patient
regarding the next appointment.

BI-RADS CATEGORY  1: Negative.

## 2021-10-29 ENCOUNTER — Ambulatory Visit (HOSPITAL_COMMUNITY): Payer: Medicaid Other

## 2022-01-28 ENCOUNTER — Encounter (HOSPITAL_COMMUNITY): Payer: Self-pay | Admitting: Emergency Medicine

## 2022-01-28 ENCOUNTER — Ambulatory Visit (HOSPITAL_COMMUNITY)
Admission: EM | Admit: 2022-01-28 | Discharge: 2022-01-28 | Disposition: A | Discharge status: Home / Self Care | Payer: Medicaid Other | Attending: Family Medicine | Admitting: Family Medicine

## 2022-01-28 DIAGNOSIS — K0889 Other specified disorders of teeth and supporting structures: Secondary | ICD-10-CM

## 2022-01-28 MED ORDER — KETOROLAC TROMETHAMINE 30 MG/ML IJ SOLN
INTRAMUSCULAR | Status: AC
  Filled 2022-01-28: qty 1

## 2022-01-28 MED ORDER — NAPROXEN 500 MG PO TABS: 500.0000 mg | ORAL_TABLET | Freq: Two times a day (BID) | ORAL | 0 refills | Status: DC | PRN

## 2022-01-28 MED ORDER — KETOROLAC TROMETHAMINE 30 MG/ML IJ SOLN
30.0000 mg | Freq: Once | INTRAMUSCULAR | Status: AC
  Administered 2022-01-28: 30 mg via INTRAMUSCULAR

## 2022-01-28 MED ORDER — AMOXICILLIN-POT CLAVULANATE 875-125 MG PO TABS: 1.0000 | ORAL_TABLET | Freq: Two times a day (BID) | ORAL | 0 refills | Status: AC

## 2022-01-28 NOTE — Discharge Instructions (Addendum)
You have been given a shot of Toradol 30 mg today.  Take amoxicillin-clavulanate 875 mg--1 tab twice daily with food for 7 days,e  Take naproxen 500 mg--1 tablet every 12 hours as needed for pain

## 2022-01-28 NOTE — ED Triage Notes (Signed)
Patient c/o RT sided mouth pain x 2 days ago.   Patient denies throat and fever.   Patient endorses increased pain with eating.   Patient endorses pain radiates to ear at times.   Patient endorses gum pain.   Patient denies previous dental abscess.   Patient has taken ibuprofen with no relief of symptoms.

## 2022-01-28 NOTE — ED Provider Notes (Signed)
[facility]    CSN: [id number] Arrival date & time: 01/28/22  0802      History   Chief Complaint Chief Complaint  Patient presents with   Dental Pain    HPI Kelly Avila is a 41 y.o. female.    Dental Pain  Here for right sided jaw pain.  She has had some swelling around the area externally and just under her jaw.  It does hurt to chew.  No fever or chills or vomiting.  400 mg of ibuprofen have not helped.  Last menstrual cycle was last week.  Past Medical History:  Diagnosis Date   Medical history non-contributory     Patient Active Problem List   Diagnosis Date Noted   Marijuana use 04/08/2015   History of unilateral salpingectomy 04/03/2015    Past Surgical History:  Procedure Laterality Date   ECTOPIC PREGNANCY SURGERY      OB History     Gravida  3   Para  2   Term  2   Preterm      AB  1   Living  2      SAB      IAB      Ectopic  1   Multiple  0   Live Births  2            Home Medications    Prior to Admission medications   Medication Sig Start Date End Date Taking? Authorizing Provider  amoxicillin-clavulanate (AUGMENTIN) 875-125 MG tablet Take 1 tablet by mouth 2 (two) times daily for 7 days. 01/28/22 02/04/22 Yes Chao Blazejewski, Janace Aris, MD  naproxen (NAPROSYN) 500 MG tablet Take 1 tablet (500 mg total) by mouth 2 (two) times daily as needed (pain). 01/28/22  Yes Valda Christenson, Janace Aris, MD    Family History Family History  Problem Relation Age of Onset   Diabetes Paternal Grandmother    Heart Problems Father    Hypertension Mother    Diabetes Maternal Aunt     Social History Social History   Tobacco Use   Smoking status: Every Day    Packs/day: 0.50    Types: Cigarettes   Smokeless tobacco: Never  Vaping Use   Vaping Use: Never used  Substance Use Topics   Alcohol use: Yes    Comment: socially   Drug use: Yes    Types: Marijuana     Allergies   Patient has no known allergies.   Review of  Systems Review of Systems   Physical Exam Triage Vital Signs ED Triage Vitals [01/28/22 0818]  Enc Vitals Group     BP 134/84     Pulse Rate 83     Resp 16     Temp 98.1 F (36.7 C)     Temp Source Oral     SpO2 98 %     Weight      Height      Head Circumference      Peak Flow      Pain Score 8     Pain Loc      Pain Edu?      Excl. in GC?    No data found.  Updated Vital Signs BP 134/84 (BP Location: Right Arm)   Pulse 83   Temp 98.1 F (36.7 C) (Oral)   Resp 16   LMP 01/21/2022 (Approximate)   SpO2 98%   Visual Acuity Right Eye Distance:   Left Eye Distance:   Bilateral Distance:  Right Eye Near:   Left Eye Near:    Bilateral Near:     Physical Exam Vitals reviewed.  Constitutional:      General: She is not in acute distress.    Appearance: She is not ill-appearing, toxic-appearing or diaphoretic.  HENT:     Head:     Comments: There is some soft swelling around the lower part of her right cheek and below her mandible on the right neck.    Mouth/Throat:     Mouth: Mucous membranes are moist.     Pharynx: No oropharyngeal exudate or posterior oropharyngeal erythema.     Comments: There is a broken tooth on her right lower posterior dental ridge.  No swelling in the mouth noted Cardiovascular:     Rate and Rhythm: Normal rate and regular rhythm.  Pulmonary:     Effort: Pulmonary effort is normal.     Breath sounds: Normal breath sounds.  Musculoskeletal:     Cervical back: Neck supple.  Lymphadenopathy:     Cervical: No cervical adenopathy.  Skin:    Coloration: Skin is not pale.  Neurological:     Mental Status: She is alert and oriented to person, place, and time.  Psychiatric:        Behavior: Behavior normal.      UC Treatments / Results  Labs (all labs ordered are listed, but only abnormal results are displayed) Labs Reviewed - No data to display  EKG   Radiology No results found.  Procedures Procedures (including critical  care time)  Medications Ordered in UC Medications  ketorolac (TORADOL) 30 MG/ML injection 30 mg (has no administration in time range)    Initial Impression / Assessment and Plan / UC Course  I have reviewed the triage vital signs and the nursing notes.  Pertinent labs & imaging results that were available during my care of the patient were reviewed by me and considered in my medical decision making (see chart for details).     She is given a list of low-cost dental providers, and we will treat with Augmentin and pain relief. Final Clinical Impressions(s) / UC Diagnoses   Final diagnoses:  Pain, dental     Discharge Instructions      You have been given a shot of Toradol 30 mg today.  Take amoxicillin-clavulanate 875 mg--1 tab twice daily with food for 7 days,e  Take naproxen 500 mg--1 tablet every 12 hours as needed for pain      ED Prescriptions     Medication Sig Dispense Auth. Provider   amoxicillin-clavulanate (AUGMENTIN) 875-125 MG tablet Take 1 tablet by mouth 2 (two) times daily for 7 days. 14 tablet Larita Deremer, Janace Aris, MD   naproxen (NAPROSYN) 500 MG tablet Take 1 tablet (500 mg total) by mouth 2 (two) times daily as needed (pain). 30 tablet Marnette Perkins, Janace Aris, MD      I have reviewed the PDMP during this encounter.   Zenia Resides, MD 01/28/22 [phone]

## 2022-03-06 ENCOUNTER — Other Ambulatory Visit: Payer: Self-pay | Admitting: Family Medicine

## 2022-03-06 DIAGNOSIS — N6310 Unspecified lump in the right breast, unspecified quadrant: Secondary | ICD-10-CM

## 2022-03-28 ENCOUNTER — Other Ambulatory Visit: Payer: Medicaid Other

## 2022-04-03 ENCOUNTER — Ambulatory Visit
Admission: EM | Admit: 2022-04-03 | Discharge: 2022-04-03 | Disposition: A | Discharge status: Home / Self Care | Payer: Medicaid Other | Attending: Urgent Care | Admitting: Urgent Care

## 2022-04-03 DIAGNOSIS — N61 Mastitis without abscess: Secondary | ICD-10-CM | POA: Diagnosis not present

## 2022-04-03 DIAGNOSIS — N644 Mastodynia: Secondary | ICD-10-CM

## 2022-04-03 MED ORDER — DOXYCYCLINE HYCLATE 100 MG PO CAPS: 100.0000 mg | ORAL_CAPSULE | Freq: Two times a day (BID) | ORAL | 0 refills | Status: DC

## 2022-04-03 MED ORDER — NAPROXEN 500 MG PO TABS: 500.0000 mg | ORAL_TABLET | Freq: Two times a day (BID) | ORAL | 0 refills | Status: DC

## 2022-04-03 NOTE — ED Triage Notes (Signed)
Patient presents to [facility] for right breast pain since yesterday. Pt states she felt a "bump" swelling and redness since yesterday. Not taking anything for pain.   Denies fever.

## 2022-04-03 NOTE — Discharge Instructions (Addendum)
Please make sure that he started doxycycline for the infection.  Use warm compresses 3-5 times a day 5 to 10 minutes at a time.  Schedule your follow-up for your repeat mammogram as you are due for that now.

## 2022-04-03 NOTE — ED Provider Notes (Signed)
[facility]  Note:  This document was prepared using Conservation officer, historic buildings and may include unintentional dictation errors.  MRN: [id number] DOB: 1980/07/26  Subjective:   Kelly Avila is a 41 y.o. female presenting for 1 day history of acute onset persistent right breast pain with swelling and redness.  Patient has had a history of similar swelling and got a mammogram which showed nonmalignant fibroglandular tissue.  She is due for a repeat mammogram as it was done 2 years ago.  No drainage, nipple discharge, rashes, trauma to the area.  No current facility-administered medications for this encounter.  Current Outpatient Medications:    naproxen (NAPROSYN) 500 MG tablet, Take 1 tablet (500 mg total) by mouth 2 (two) times daily as needed (pain)., Disp: 30 tablet, Rfl: 0   No Known Allergies  Past Medical History:  Diagnosis Date   Medical history non-contributory      Past Surgical History:  Procedure Laterality Date   ECTOPIC PREGNANCY SURGERY      Family History  Problem Relation Age of Onset   Diabetes Paternal Grandmother    Heart Problems Father    Hypertension Mother    Diabetes Maternal Aunt     Social History   Tobacco Use   Smoking status: Every Day    Packs/day: 0.50    Types: Cigarettes   Smokeless tobacco: Never  Vaping Use   Vaping Use: Never used  Substance Use Topics   Alcohol use: Yes    Comment: socially   Drug use: Yes    Types: Marijuana    ROS   Objective:   Vitals: BP (!) 153/96 (BP Location: Right Arm)   Pulse 80   Temp 98.6 F (37 C) (Oral)   Resp 15   LMP 03/06/2022 (Approximate)   SpO2 98%   Physical Exam Exam conducted with a chaperone present (RN Denhora).  Constitutional:      General: She is not in acute distress.    Appearance: Normal appearance. She is well-developed. She is not ill-appearing, toxic-appearing or diaphoretic.  HENT:     Head: Normocephalic and atraumatic.      Nose: Nose normal.     Mouth/Throat:     Mouth: Mucous membranes are moist.  Eyes:     General: No scleral icterus.       Right eye: No discharge.        Left eye: No discharge.     Extraocular Movements: Extraocular movements intact.  Cardiovascular:     Rate and Rhythm: Normal rate.  Pulmonary:     Effort: Pulmonary effort is normal.  Chest:    Skin:    General: Skin is warm and dry.  Neurological:     General: No focal deficit present.     Mental Status: She is alert and oriented to person, place, and time.  Psychiatric:        Mood and Affect: Mood normal.        Behavior: Behavior normal.     Assessment and Plan :   PDMP not reviewed this encounter.  1. Mastitis, acute   2. Breast pain, right    Recommended managing for mastitis with doxycycline.  Use naproxen for pain and inflammation.  Area is not amenable to incision and drainage.  Recommended that she pursue and schedule her repeat mammogram as scheduled. Counseled patient on potential for adverse effects with medications prescribed/recommended today, ER and return-to-clinic precautions discussed, patient verbalized understanding.  Jaynee Eagles, [place] 04/03/22 1445

## 2022-04-16 ENCOUNTER — Emergency Department (HOSPITAL_BASED_OUTPATIENT_CLINIC_OR_DEPARTMENT_OTHER)
Admission: EM | Admit: 2022-04-16 | Discharge: 2022-04-16 | Disposition: A | Discharge status: Home / Self Care | Payer: Medicaid Other | Attending: Emergency Medicine | Admitting: Emergency Medicine

## 2022-04-16 ENCOUNTER — Ambulatory Visit
Admission: EM | Admit: 2022-04-16 | Discharge: 2022-04-16 | Disposition: A | Discharge status: Home / Self Care | Payer: Medicaid Other

## 2022-04-16 ENCOUNTER — Encounter (HOSPITAL_BASED_OUTPATIENT_CLINIC_OR_DEPARTMENT_OTHER): Payer: Self-pay | Admitting: *Deleted

## 2022-04-16 ENCOUNTER — Other Ambulatory Visit: Payer: Self-pay

## 2022-04-16 DIAGNOSIS — N611 Abscess of the breast and nipple: Secondary | ICD-10-CM

## 2022-04-16 LAB — COMPREHENSIVE METABOLIC PANEL
ALT: 17 U/L (ref 0–44)
AST: 13 U/L — ABNORMAL LOW (ref 15–41)
Albumin: 4.1 g/dL (ref 3.5–5.0)
Alkaline Phosphatase: 62 U/L (ref 38–126)
Anion gap: 9 (ref 5–15)
BUN: 9 mg/dL (ref 6–20)
CO2: 25 mmol/L (ref 22–32)
Calcium: 9.4 mg/dL (ref 8.9–10.3)
Chloride: 104 mmol/L (ref 98–111)
Creatinine, Ser: 1.05 mg/dL — ABNORMAL HIGH (ref 0.44–1.00)
GFR, Estimated: >60 mL/min (ref >60)
Glucose, Bld: 91 mg/dL (ref 70–99)
Potassium: 4 mmol/L (ref 3.5–5.1)
Sodium: 138 mmol/L (ref 135–145)
Total Bilirubin: 0.5 mg/dL (ref 0.3–1.2)
Total Protein: 7.5 g/dL (ref 6.5–8.1)

## 2022-04-16 LAB — CBC WITH DIFFERENTIAL/PLATELET
Abs Immature Granulocytes: 0.1 10*3/uL — ABNORMAL HIGH (ref 0.00–0.07)
Basophils Absolute: 0 10*3/uL (ref 0.0–0.1)
Basophils Relative: 1 %
Eosinophils Absolute: 0.3 10*3/uL (ref 0.0–0.5)
Eosinophils Relative: 3 %
HCT: 40 % (ref 36.0–46.0)
Hemoglobin: 13 g/dL (ref 12.0–15.0)
Immature Granulocytes: 1 %
Lymphocytes Relative: 29 %
Lymphs Abs: 2.5 10*3/uL (ref 0.7–4.0)
MCH: 31 pg (ref 26.0–34.0)
MCHC: 32.5 g/dL (ref 30.0–36.0)
MCV: 95.2 fL (ref 80.0–100.0)
Monocytes Absolute: 0.4 10*3/uL (ref 0.1–1.0)
Monocytes Relative: 4 %
Neutro Abs: 5.4 10*3/uL (ref 1.7–7.7)
Neutrophils Relative %: 62 %
Platelets: 291 10*3/uL (ref 150–400)
RBC: 4.2 MIL/uL (ref 3.87–5.11)
RDW: 13.1 % (ref 11.5–15.5)
WBC: 8.6 10*3/uL (ref 4.0–10.5)
nRBC: 0 % (ref 0.0–0.2)

## 2022-04-16 LAB — PREGNANCY, URINE: Preg Test, Ur: NEGATIVE

## 2022-04-16 LAB — PROTIME-INR
INR: 1.2 (ref 0.8–1.2)
Prothrombin Time: 14.8 seconds (ref 11.4–15.2)

## 2022-04-16 LAB — LACTIC ACID, PLASMA: Lactic Acid, Venous: 0.8 mmol/L (ref 0.5–1.9)

## 2022-04-16 NOTE — Discharge Instructions (Signed)
Will need to follow-up with [facility] regarding the lesion on your breast.  Please call and schedule appointment today.  Continue taking the doxycycline and finish out the prescription.  If you have fever, new concerning symptoms return to the ED for evaluation.

## 2022-04-16 NOTE — ED Triage Notes (Signed)
Pt was sent here from [facility] due to abscess like lesion on right breast which pt reports opened up about 2 days ago.  Pt also has pain in skin in surrounding area.

## 2022-04-16 NOTE — ED Provider Notes (Signed)
Patient here today for continued right breast abscess. She reports she has had drainage from abscess and she is taking doxycycline as prescribed but she continues to have worsening symptoms and spreading pain through her right breast and into her left chest now. She has not had fever. She is unable to wear a bra due to pain. On exam she has a significant ulceration to right breast- recommended further evaluation in the ED for labs and imaging given worsening symptoms after drainage and antibiotic therapy. Patient is agreeable.    Francene Finders, PA-C 04/16/22 [phone]

## 2022-04-16 NOTE — ED Provider Notes (Signed)
[facility] EMERGENCY DEPT Provider Note   CSN: [id number] Arrival date & time: 04/16/22  0940     History  Chief Complaint  Patient presents with   Breast Problem    Kelly Avila is a 41 y.o. female.  HPI   Patient presents due to breast abscess versus ulcer to her right breast x1 week.  States it was initially just a small hard lump and has been there for multiple years, had a mammogram 4 years ago which was unremarkable.  About a week and a half ago it started growing, draining purulent material.  She was started on doxycycline which she initially was taking but stopped taking after a few days patient like the side effects.  Denies any fevers or chills, does not breast-feed.  Not pregnant.  Seen at urgent care, sent to ED for additional evaluation.  Home Medications Prior to Admission medications   Medication Sig Start Date End Date Taking? Authorizing Provider  doxycycline (VIBRAMYCIN) 100 MG capsule Take 1 capsule (100 mg total) by mouth 2 (two) times daily. 04/03/22   Wallis Bamberg, PA-C  naproxen (NAPROSYN) 500 MG tablet Take 1 tablet (500 mg total) by mouth 2 (two) times daily with a meal. 04/03/22   Wallis Bamberg, PA-C      Allergies    Patient has no known allergies.    Review of Systems   Review of Systems  Physical Exam Updated Vital Signs BP 131/82   Pulse 77   Temp 98.2 F (36.8 C) (Oral)   Resp 16   Ht 5\' 7"  (1.702 m)   Wt 89.8 kg   LMP 03/06/2022 (Approximate)   SpO2 100%   BMI 31.01 kg/m  Physical Exam Vitals and nursing note reviewed. Exam conducted with a chaperone present.  Constitutional:      Appearance: Normal appearance.  HENT:     Head: Normocephalic and atraumatic.  Eyes:     General: No scleral icterus.       Right eye: No discharge.        Left eye: No discharge.     Extraocular Movements: Extraocular movements intact.     Pupils: Pupils are equal, round, and reactive to light.  Cardiovascular:     Rate and Rhythm:  Normal rate and regular rhythm.     Pulses: Normal pulses.     Heart sounds: Normal heart sounds. No murmur heard.    No friction rub. No gallop.  Pulmonary:     Effort: Pulmonary effort is normal. No respiratory distress.     Breath sounds: Normal breath sounds.  Abdominal:     General: Abdomen is flat. Bowel sounds are normal. There is no distension.     Palpations: Abdomen is soft.     Tenderness: There is no abdominal tenderness.  Skin:    General: Skin is warm and dry.     Coloration: Skin is not jaundiced.     Comments: Right breast with ulcer. Some mild erythema surrounding but no fluctuance.  Slightly indurated appearing.  Does not involve the nipple or areola.  Neurological:     Mental Status: She is alert. Mental status is at baseline.     Coordination: Coordination normal.     ED Results / Procedures / Treatments   Labs (all labs ordered are listed, but only abnormal results are displayed) Labs Reviewed  COMPREHENSIVE METABOLIC PANEL - Abnormal; Notable for the following components:      Result Value   Creatinine, Ser 1.05 (*)  AST 13 (*)    All other components within normal limits  CBC WITH DIFFERENTIAL/PLATELET - Abnormal; Notable for the following components:   Abs Immature Granulocytes 0.10 (*)    All other components within normal limits  CULTURE, BLOOD (ROUTINE X 2)  CULTURE, BLOOD (ROUTINE X 2)  LACTIC ACID, PLASMA  PROTIME-INR  PREGNANCY, URINE    EKG None  Radiology No results found.  Procedures Procedures    Medications Ordered in ED Medications - No data to display  ED Course/ Medical Decision Making/ A&P                           Medical Decision Making Amount and/or Complexity of Data Reviewed Labs: ordered.   Patient presents due to right breast abscess/ulcer.    I ordered, viewed and interpreted laboratory work-up. -CBC with a leukocytosis or anemia.   -Lactic acid is negative -PT/INR is unremarkable and patient not  pregnant.   -CMP is without any gross electrolyte derangement, transaminitis.  Creatinine is mildly elevated at 1.05  Patient does not meet SIRS criteria, does not appear septic.    She has not been medically compliant with the doxycycline, she will start and finish it.  Breast abscess versus ulcer versus malignancy. I do not think patient has any emergent condition that requires admission or IV antibiotics, will have her follow-up with the [place] [facility] for reevaluation.         Final Clinical Impression(s) / ED Diagnoses Final diagnoses:  Breast abscess    Rx / DC Orders ED Discharge Orders     None         Sherrill Raring, Hershal Coria 04/16/22 2217    Varney Biles, MD 04/17/22 1239

## 2022-04-16 NOTE — ED Triage Notes (Signed)
Pt c/o abscess-like lesion that has been in the right breast for years. States ob/gyn is aware of it and has had mammograms. About 2-3 days ago the abscess-like lesion has opened and appears similar to an ulcer. Reports ob/gyn aware of this as well as being seen at [address] and given abx but the situation appears to be getting worse.

## 2022-04-21 LAB — CULTURE, BLOOD (ROUTINE X 2)
Culture: NO GROWTH
Culture: NO GROWTH
Special Requests: ADEQUATE
Special Requests: ADEQUATE

## 2022-05-17 ENCOUNTER — Other Ambulatory Visit: Payer: Self-pay

## 2022-05-17 ENCOUNTER — Encounter (HOSPITAL_COMMUNITY): Payer: Self-pay | Admitting: *Deleted

## 2022-05-17 ENCOUNTER — Emergency Department (HOSPITAL_COMMUNITY)
Admission: EM | Admit: 2022-05-17 | Discharge: 2022-05-17 | Disposition: A | Discharge status: Home / Self Care | Payer: Medicaid Other | Attending: Emergency Medicine | Admitting: Emergency Medicine

## 2022-05-17 DIAGNOSIS — K0889 Other specified disorders of teeth and supporting structures: Secondary | ICD-10-CM | POA: Insufficient documentation

## 2022-05-17 DIAGNOSIS — R220 Localized swelling, mass and lump, head: Secondary | ICD-10-CM | POA: Diagnosis present

## 2022-05-17 MED ORDER — NAPROXEN 500 MG PO TABS: 500.0000 mg | ORAL_TABLET | Freq: Two times a day (BID) | ORAL | 0 refills | Status: AC

## 2022-05-17 MED ORDER — KETOROLAC TROMETHAMINE 60 MG/2ML IM SOLN
30.0000 mg | Freq: Once | INTRAMUSCULAR | Status: AC
  Administered 2022-05-17: 30 mg via INTRAMUSCULAR
  Filled 2022-05-17: qty 2

## 2022-05-17 MED ORDER — KETOROLAC TROMETHAMINE 30 MG/ML IJ SOLN: 30.0000 mg | Freq: Once | INTRAMUSCULAR | 0 refills | Status: DC

## 2022-05-17 MED ORDER — AMOXICILLIN-POT CLAVULANATE 875-125 MG PO TABS: 1.0000 | ORAL_TABLET | Freq: Two times a day (BID) | ORAL | 0 refills | Status: AC

## 2022-05-17 NOTE — Discharge Instructions (Signed)
You have been seen in the ED for facial swelling and dental pain. This is likely caused by an underlying dental infection. Please pickup antibiotics at the pharmacy today and start taking as prescribed. If you develop any worsening symptoms such as difficulty breathing, difficulty swallowing, inability to tolerate your own secretions, or fevers please return to the emergency department.  Please call and schedule a follow up appointment with Dr. Geralynn Ochs or use any of the resources provided on the dental resource guide.

## 2022-05-17 NOTE — ED Triage Notes (Signed)
Here from home for R mandibular swelling and pain, relates to dental problem, onset yesterday, notable poor dentition, caries, missing teeth, and holes in teeth. R mandibular swelling noted. TTP. Denies fever, drainage, or vomiting. Does not have dentist. Took tylenol PM and excedrin last night. Rates 10/10. 2 minor children present. Denies recent antibiotics in last 3 months.

## 2022-05-17 NOTE — ED Provider Notes (Signed)
[facility] DEPT Provider Note   CSN: [id number] Arrival date & time: 05/17/22  8756     History  Chief Complaint  Patient presents with   Oral Swelling    Kelly Avila is a 41 y.o. female. Patient presenting to the ED with right-sided facial swelling as well as dental pain on the right lower molar.  She says the symptoms started yesterday and the pain is worsened.  She reports no fevers, chills, difficulty swallowing, voice changes, recent trauma to the area, difficulty breathing.  She says she is tolerating her secretions without difficulty.  She has had similar problems in the past with dental infections.  She recently completed a course of doxycycline due to a suspected breast abscess.  HPI     Home Medications Prior to Admission medications   Medication Sig Start Date End Date Taking? Authorizing Provider  amoxicillin-clavulanate (AUGMENTIN) 875-125 MG tablet Take 1 tablet by mouth every 12 (twelve) hours for 7 days. 05/17/22 05/24/22 Yes Latana Colin, Adora Fridge, PA-C  naproxen (NAPROSYN) 500 MG tablet Take 1 tablet (500 mg total) by mouth 2 (two) times daily for 7 days. 05/17/22 05/24/22 Yes Graeden Bitner, Adora Fridge, PA-C      Allergies    Patient has no known allergies.    Review of Systems   Review of Systems  HENT:  Positive for dental problem and facial swelling.   All other systems reviewed and are negative.   Physical Exam Updated Vital Signs BP (!) 153/113 (BP Location: Left Arm)   Pulse (!) 103   Temp 98.3 F (36.8 C) (Oral)   Resp 18   Ht 5\' 7"  (1.702 m)   Wt 90.7 kg   LMP 04/16/2022 (Approximate)   SpO2 100%   BMI 31.32 kg/m  Physical Exam Vitals and nursing note reviewed.  Constitutional:      General: She is not in acute distress.    Appearance: Normal appearance. She is well-developed. She is not ill-appearing, toxic-appearing or diaphoretic.  HENT:     Head: Normocephalic and atraumatic.     Comments: No submandibular or  sublingual tenderness or swelling, no tenderness underneath mouth  Mild swelling localized to right mandible    Nose: Nose normal. No nasal deformity.     Mouth/Throat:     Lips: Pink. No lesions.     Mouth: Mucous membranes are moist.     Dentition: Abnormal dentition. Dental tenderness and dental caries present.     Pharynx: Oropharynx is clear. Uvula midline. No pharyngeal swelling, oropharyngeal exudate, posterior oropharyngeal erythema or uvula swelling.     Tonsils: No tonsillar exudate or tonsillar abscesses. 0 on the right. 0 on the left.      Comments: #1: missing tooth, tenderness at the base of gum.   Generally very poor dentition.   No elevation of tongue or at base. No tenderness or induration.  Eyes:     General: Gaze aligned appropriately. No scleral icterus.       Right eye: No discharge.        Left eye: No discharge.     Conjunctiva/sclera: Conjunctivae normal.     Right eye: Right conjunctiva is not injected. No exudate or hemorrhage.    Left eye: Left conjunctiva is not injected. No exudate or hemorrhage. Pulmonary:     Effort: Pulmonary effort is normal. No respiratory distress.  Skin:    General: Skin is warm and dry.  Neurological:     Mental Status: She is alert  and oriented to person, place, and time.  Psychiatric:        Mood and Affect: Mood normal.        Speech: Speech normal.        Behavior: Behavior normal. Behavior is cooperative.     ED Results / Procedures / Treatments   Labs (all labs ordered are listed, but only abnormal results are displayed) Labs Reviewed - No data to display  EKG None  Radiology No results found.  Procedures Procedures   Medications Ordered in ED Medications  ketorolac (TORADOL) injection 30 mg (has no administration in time range)    ED Course/ Medical Decision Making/ A&P                           Medical Decision Making Risk Prescription drug management.   Patient being seen for right facial  swelling and dental pain. She is afebrile and has stable vitals. Exam not consistent with ludwigs Angina. Airway is intact. Tolerating Secretions. No trismus or voice changes. Low suspicion for deep space neck infection. No signs of PTA or tonsillitis on exam. Likely related to dental infection. Without any systemic symptoms, I do not feel any labs or imaging is needed at this time. I have prescribed her antibiotics and Naproxen for pain. She is given strict return precautions with development of worsening symptoms. She is given dental follow up instructions. Stable for discharge.    Final Clinical Impression(s) / ED Diagnoses Final diagnoses:  Pain, dental    Rx / DC Orders ED Discharge Orders          Ordered    ketorolac (TORADOL) 30 MG/ML injection   Once,   Status:  Discontinued        05/17/22 0840    amoxicillin-clavulanate (AUGMENTIN) 875-125 MG tablet  Every 12 hours        05/17/22 0841    naproxen (NAPROSYN) 500 MG tablet  2 times daily        05/17/22 0841              Brendyn Mclaren, Finis Bud, PA-C 05/17/22 0857    Lorre Nick, MD 05/17/22 [phone]
